# Patient Record
Sex: Female | Born: 1950
Health system: Southern US, Community
[De-identification: ages and names within clinical notes are randomized; demographics above are authoritative.]

## PROBLEM LIST (undated history)

## (undated) DIAGNOSIS — K219 Gastro-esophageal reflux disease without esophagitis: Secondary | ICD-10-CM

## (undated) DIAGNOSIS — M810 Age-related osteoporosis without current pathological fracture: Secondary | ICD-10-CM

## (undated) DIAGNOSIS — J189 Pneumonia, unspecified organism: Secondary | ICD-10-CM

## (undated) DIAGNOSIS — F329 Major depressive disorder, single episode, unspecified: Secondary | ICD-10-CM

## (undated) DIAGNOSIS — M1990 Unspecified osteoarthritis, unspecified site: Secondary | ICD-10-CM

## (undated) DIAGNOSIS — F32A Depression, unspecified: Secondary | ICD-10-CM

## (undated) DIAGNOSIS — J40 Bronchitis, not specified as acute or chronic: Secondary | ICD-10-CM

## (undated) HISTORY — PX: APPENDECTOMY: SHX54

## (undated) HISTORY — PX: KNEE ARTHROSCOPY: SUR90

## (undated) HISTORY — PX: ABDOMINAL HYSTERECTOMY: SHX81

## (undated) HISTORY — PX: WRIST SURGERY: SHX841

---

## 2015-06-18 HISTORY — PX: COLONOSCOPY: SHX174

## 2015-09-21 HISTORY — PX: BACK SURGERY: SHX140

## 2015-10-01 DIAGNOSIS — H5210 Myopia, unspecified eye: Secondary | ICD-10-CM | POA: Diagnosis not present

## 2015-10-01 DIAGNOSIS — H52229 Regular astigmatism, unspecified eye: Secondary | ICD-10-CM | POA: Diagnosis not present

## 2015-10-02 DIAGNOSIS — M25561 Pain in right knee: Secondary | ICD-10-CM | POA: Diagnosis not present

## 2015-10-09 DIAGNOSIS — M4316 Spondylolisthesis, lumbar region: Secondary | ICD-10-CM | POA: Diagnosis not present

## 2015-10-09 DIAGNOSIS — M1711 Unilateral primary osteoarthritis, right knee: Secondary | ICD-10-CM | POA: Diagnosis not present

## 2015-10-09 DIAGNOSIS — M5441 Lumbago with sciatica, right side: Secondary | ICD-10-CM | POA: Diagnosis not present

## 2015-10-25 DIAGNOSIS — M4316 Spondylolisthesis, lumbar region: Secondary | ICD-10-CM | POA: Diagnosis not present

## 2015-10-25 DIAGNOSIS — S3992XA Unspecified injury of lower back, initial encounter: Secondary | ICD-10-CM | POA: Diagnosis not present

## 2015-10-25 DIAGNOSIS — M5116 Intervertebral disc disorders with radiculopathy, lumbar region: Secondary | ICD-10-CM | POA: Diagnosis not present

## 2015-10-25 DIAGNOSIS — M4806 Spinal stenosis, lumbar region: Secondary | ICD-10-CM | POA: Diagnosis not present

## 2015-10-30 DIAGNOSIS — H524 Presbyopia: Secondary | ICD-10-CM | POA: Diagnosis not present

## 2015-10-30 DIAGNOSIS — H52209 Unspecified astigmatism, unspecified eye: Secondary | ICD-10-CM | POA: Diagnosis not present

## 2015-10-30 DIAGNOSIS — H5203 Hypermetropia, bilateral: Secondary | ICD-10-CM | POA: Diagnosis not present

## 2015-10-30 DIAGNOSIS — Z0100 Encounter for examination of eyes and vision without abnormal findings: Secondary | ICD-10-CM | POA: Diagnosis not present

## 2015-11-03 DIAGNOSIS — G8929 Other chronic pain: Secondary | ICD-10-CM | POA: Diagnosis not present

## 2015-11-03 DIAGNOSIS — M5441 Lumbago with sciatica, right side: Secondary | ICD-10-CM | POA: Diagnosis not present

## 2015-11-07 DIAGNOSIS — M25561 Pain in right knee: Secondary | ICD-10-CM | POA: Diagnosis not present

## 2015-11-07 DIAGNOSIS — K219 Gastro-esophageal reflux disease without esophagitis: Secondary | ICD-10-CM | POA: Diagnosis not present

## 2015-12-10 DIAGNOSIS — Z1231 Encounter for screening mammogram for malignant neoplasm of breast: Secondary | ICD-10-CM | POA: Diagnosis not present

## 2015-12-12 DIAGNOSIS — R1013 Epigastric pain: Secondary | ICD-10-CM | POA: Diagnosis not present

## 2015-12-12 DIAGNOSIS — K219 Gastro-esophageal reflux disease without esophagitis: Secondary | ICD-10-CM | POA: Diagnosis not present

## 2015-12-16 DIAGNOSIS — R030 Elevated blood-pressure reading, without diagnosis of hypertension: Secondary | ICD-10-CM | POA: Diagnosis not present

## 2015-12-16 DIAGNOSIS — M5416 Radiculopathy, lumbar region: Secondary | ICD-10-CM | POA: Diagnosis not present

## 2015-12-16 DIAGNOSIS — M4316 Spondylolisthesis, lumbar region: Secondary | ICD-10-CM | POA: Diagnosis not present

## 2015-12-16 DIAGNOSIS — M5126 Other intervertebral disc displacement, lumbar region: Secondary | ICD-10-CM | POA: Diagnosis not present

## 2016-01-05 DIAGNOSIS — E785 Hyperlipidemia, unspecified: Secondary | ICD-10-CM | POA: Diagnosis not present

## 2016-01-05 DIAGNOSIS — Z87891 Personal history of nicotine dependence: Secondary | ICD-10-CM | POA: Diagnosis not present

## 2016-01-05 DIAGNOSIS — Z79899 Other long term (current) drug therapy: Secondary | ICD-10-CM | POA: Diagnosis not present

## 2016-01-05 DIAGNOSIS — R1013 Epigastric pain: Secondary | ICD-10-CM | POA: Diagnosis not present

## 2016-01-05 DIAGNOSIS — G43909 Migraine, unspecified, not intractable, without status migrainosus: Secondary | ICD-10-CM | POA: Diagnosis not present

## 2016-01-05 DIAGNOSIS — K2900 Acute gastritis without bleeding: Secondary | ICD-10-CM | POA: Diagnosis not present

## 2016-01-05 DIAGNOSIS — K219 Gastro-esophageal reflux disease without esophagitis: Secondary | ICD-10-CM | POA: Diagnosis not present

## 2016-01-05 DIAGNOSIS — F419 Anxiety disorder, unspecified: Secondary | ICD-10-CM | POA: Diagnosis not present

## 2016-01-05 DIAGNOSIS — M1990 Unspecified osteoarthritis, unspecified site: Secondary | ICD-10-CM | POA: Diagnosis not present

## 2016-01-05 DIAGNOSIS — K2970 Gastritis, unspecified, without bleeding: Secondary | ICD-10-CM | POA: Diagnosis not present

## 2016-01-05 DIAGNOSIS — F329 Major depressive disorder, single episode, unspecified: Secondary | ICD-10-CM | POA: Diagnosis not present

## 2016-01-05 HISTORY — PX: ESOPHAGOGASTRODUODENOSCOPY: SHX1529

## 2016-01-09 DIAGNOSIS — N390 Urinary tract infection, site not specified: Secondary | ICD-10-CM | POA: Diagnosis not present

## 2016-01-09 DIAGNOSIS — B373 Candidiasis of vulva and vagina: Secondary | ICD-10-CM | POA: Diagnosis not present

## 2016-01-23 DIAGNOSIS — B379 Candidiasis, unspecified: Secondary | ICD-10-CM | POA: Diagnosis not present

## 2016-01-29 DIAGNOSIS — M5416 Radiculopathy, lumbar region: Secondary | ICD-10-CM | POA: Diagnosis not present

## 2016-01-29 DIAGNOSIS — M5126 Other intervertebral disc displacement, lumbar region: Secondary | ICD-10-CM | POA: Diagnosis not present

## 2016-02-11 DIAGNOSIS — J302 Other seasonal allergic rhinitis: Secondary | ICD-10-CM | POA: Diagnosis not present

## 2016-02-11 DIAGNOSIS — R05 Cough: Secondary | ICD-10-CM | POA: Diagnosis not present

## 2016-02-13 DIAGNOSIS — R05 Cough: Secondary | ICD-10-CM | POA: Diagnosis not present

## 2016-02-27 DIAGNOSIS — M62838 Other muscle spasm: Secondary | ICD-10-CM | POA: Diagnosis not present

## 2016-03-04 DIAGNOSIS — M5126 Other intervertebral disc displacement, lumbar region: Secondary | ICD-10-CM | POA: Diagnosis not present

## 2016-03-04 DIAGNOSIS — M5416 Radiculopathy, lumbar region: Secondary | ICD-10-CM | POA: Diagnosis not present

## 2016-03-10 DIAGNOSIS — M545 Low back pain: Secondary | ICD-10-CM | POA: Diagnosis not present

## 2016-03-10 DIAGNOSIS — L298 Other pruritus: Secondary | ICD-10-CM | POA: Diagnosis not present

## 2016-03-10 DIAGNOSIS — M150 Primary generalized (osteo)arthritis: Secondary | ICD-10-CM | POA: Diagnosis not present

## 2016-03-10 DIAGNOSIS — E7800 Pure hypercholesterolemia, unspecified: Secondary | ICD-10-CM | POA: Diagnosis not present

## 2016-03-10 DIAGNOSIS — K219 Gastro-esophageal reflux disease without esophagitis: Secondary | ICD-10-CM | POA: Diagnosis not present

## 2016-03-10 DIAGNOSIS — Z1389 Encounter for screening for other disorder: Secondary | ICD-10-CM | POA: Diagnosis not present

## 2016-03-10 DIAGNOSIS — E559 Vitamin D deficiency, unspecified: Secondary | ICD-10-CM | POA: Diagnosis not present

## 2016-03-10 DIAGNOSIS — Z0000 Encounter for general adult medical examination without abnormal findings: Secondary | ICD-10-CM | POA: Diagnosis not present

## 2016-03-10 DIAGNOSIS — M810 Age-related osteoporosis without current pathological fracture: Secondary | ICD-10-CM | POA: Diagnosis not present

## 2016-03-10 DIAGNOSIS — G8929 Other chronic pain: Secondary | ICD-10-CM | POA: Diagnosis not present

## 2016-03-11 DIAGNOSIS — M79651 Pain in right thigh: Secondary | ICD-10-CM | POA: Diagnosis not present

## 2016-03-11 DIAGNOSIS — M25551 Pain in right hip: Secondary | ICD-10-CM | POA: Diagnosis not present

## 2016-03-11 DIAGNOSIS — S79911A Unspecified injury of right hip, initial encounter: Secondary | ICD-10-CM | POA: Diagnosis not present

## 2016-03-24 DIAGNOSIS — M810 Age-related osteoporosis without current pathological fracture: Secondary | ICD-10-CM | POA: Diagnosis not present

## 2016-03-26 DIAGNOSIS — G4700 Insomnia, unspecified: Secondary | ICD-10-CM | POA: Diagnosis not present

## 2016-03-30 DIAGNOSIS — M4316 Spondylolisthesis, lumbar region: Secondary | ICD-10-CM | POA: Diagnosis not present

## 2016-03-30 DIAGNOSIS — R030 Elevated blood-pressure reading, without diagnosis of hypertension: Secondary | ICD-10-CM | POA: Diagnosis not present

## 2016-03-31 DIAGNOSIS — N952 Postmenopausal atrophic vaginitis: Secondary | ICD-10-CM | POA: Diagnosis not present

## 2016-04-01 ENCOUNTER — Other Ambulatory Visit: Payer: Self-pay | Admitting: Neurosurgery

## 2016-05-19 ENCOUNTER — Encounter (HOSPITAL_COMMUNITY): Payer: Self-pay | Admitting: Surgery

## 2016-05-19 ENCOUNTER — Encounter (HOSPITAL_COMMUNITY)
Admission: RE | Admit: 2016-05-19 | Discharge: 2016-05-19 | Disposition: A | Discharge status: Home / Self Care | Payer: Commercial Managed Care - HMO | Source: Ambulatory Visit | Attending: Neurosurgery | Admitting: Neurosurgery

## 2016-05-19 DIAGNOSIS — Z01812 Encounter for preprocedural laboratory examination: Secondary | ICD-10-CM | POA: Insufficient documentation

## 2016-05-19 DIAGNOSIS — Z0183 Encounter for blood typing: Secondary | ICD-10-CM | POA: Insufficient documentation

## 2016-05-19 DIAGNOSIS — M4316 Spondylolisthesis, lumbar region: Secondary | ICD-10-CM | POA: Diagnosis not present

## 2016-05-19 HISTORY — DX: Pneumonia, unspecified organism: J18.9

## 2016-05-19 HISTORY — DX: Major depressive disorder, single episode, unspecified: F32.9

## 2016-05-19 HISTORY — DX: Depression, unspecified: F32.A

## 2016-05-19 HISTORY — DX: Age-related osteoporosis without current pathological fracture: M81.0

## 2016-05-19 HISTORY — DX: Gastro-esophageal reflux disease without esophagitis: K21.9

## 2016-05-19 HISTORY — DX: Bronchitis, not specified as acute or chronic: J40

## 2016-05-19 HISTORY — DX: Unspecified osteoarthritis, unspecified site: M19.90

## 2016-05-19 LAB — CBC
HEMATOCRIT: 39.6 % (ref 36.0–46.0)
HEMOGLOBIN: 12.5 g/dL (ref 12.0–15.0)
MCH: 27.8 pg (ref 26.0–34.0)
MCHC: 31.6 g/dL (ref 30.0–36.0)
MCV: 88.2 fL (ref 78.0–100.0)
Platelets: 401 10*3/uL — ABNORMAL HIGH (ref 150–400)
RBC: 4.49 MIL/uL (ref 3.87–5.11)
RDW: 14 % (ref 11.5–15.5)
WBC: 5.8 10*3/uL (ref 4.0–10.5)

## 2016-05-19 LAB — TYPE AND SCREEN
ABO/RH(D): O POS
ANTIBODY SCREEN: NEGATIVE

## 2016-05-19 LAB — BASIC METABOLIC PANEL
Anion gap: 8 (ref 5–15)
BUN: 10 mg/dL (ref 6–20)
CALCIUM: 9.6 mg/dL (ref 8.9–10.3)
CHLORIDE: 108 mmol/L (ref 101–111)
CO2: 27 mmol/L (ref 22–32)
CREATININE: 0.59 mg/dL (ref 0.44–1.00)
GFR calc non Af Amer: >60 mL/min (ref >60)
GLUCOSE: 77 mg/dL (ref 65–99)
Potassium: 3.6 mmol/L (ref 3.5–5.1)
Sodium: 143 mmol/L (ref 135–145)

## 2016-05-19 LAB — SURGICAL PCR SCREEN
MRSA, PCR: NEGATIVE
STAPHYLOCOCCUS AUREUS: NEGATIVE

## 2016-05-19 LAB — ABO/RH: ABO/RH(D): O POS

## 2016-05-19 MED ORDER — CHLORHEXIDINE GLUCONATE CLOTH 2 % EX PADS: 6.0000 | MEDICATED_PAD | Freq: Once | CUTANEOUS | Status: DC

## 2016-05-19 NOTE — Pre-Procedure Instructions (Signed)
Havanah Patla  05/19/2016     Your procedure is scheduled on : Thursday May 27, 2016 at 7:30 AM.   Report to [facility] Admitting at 5:30 AM.  Call this number if you have problems the morning of surgery: [phone]    Remember:  Do not eat food or drink liquids after midnight.  Take these medicines the morning of surgery with A SIP OF WATER : Dexilant   Stop taking any vitamins, herbal medications/supplements, NSAIDS, Ibuprofen, Advil, Motrin, Aleve, etc today    Do not wear jewelry, make-up or nail polish.  Do not wear lotions, powders, or perfumes, or deoderant.  Do not shave 48 hours prior to surgery.    Do not bring valuables to the hospital.  [facility] is not responsible for any belongings or valuables.  Contacts, dentures or bridgework may not be worn into surgery.  Leave your suitcase in the car.  After surgery it may be brought to your room.  For patients admitted to the hospital, discharge time will be determined by your treatment team.  Patients discharged the day of surgery will not be allowed to drive home.   Name and phone number of your driver:    Special instructions:  Shower using CHG soap the night before and the morning of your surgery  Please read over the following fact sheets that you were given.

## 2016-05-26 MED ORDER — CEFAZOLIN SODIUM-DEXTROSE 2-4 GM/100ML-% IV SOLN
2.0000 g | INTRAVENOUS | Status: AC
  Administered 2016-05-27: 2 g via INTRAVENOUS
  Filled 2016-05-26: qty 100

## 2016-05-27 ENCOUNTER — Inpatient Hospital Stay (HOSPITAL_COMMUNITY): Payer: Commercial Managed Care - HMO

## 2016-05-27 ENCOUNTER — Inpatient Hospital Stay (HOSPITAL_COMMUNITY): Payer: Commercial Managed Care - HMO | Admitting: Anesthesiology

## 2016-05-27 ENCOUNTER — Inpatient Hospital Stay (HOSPITAL_COMMUNITY)
Admission: RE | Disposition: A | Discharge status: Home / Self Care | Payer: Self-pay | Source: Ambulatory Visit | Attending: Neurosurgery

## 2016-05-27 ENCOUNTER — Inpatient Hospital Stay (HOSPITAL_COMMUNITY)
Admission: RE | Admit: 2016-05-27 | Discharge: 2016-05-28 | DRG: 460 | Disposition: A | Discharge status: Home / Self Care | Payer: Commercial Managed Care - HMO | Source: Ambulatory Visit | Attending: Neurosurgery | Admitting: Neurosurgery

## 2016-05-27 ENCOUNTER — Encounter (HOSPITAL_COMMUNITY): Payer: Self-pay | Admitting: Urology

## 2016-05-27 DIAGNOSIS — M5116 Intervertebral disc disorders with radiculopathy, lumbar region: Secondary | ICD-10-CM | POA: Diagnosis not present

## 2016-05-27 DIAGNOSIS — Z419 Encounter for procedure for purposes other than remedying health state, unspecified: Secondary | ICD-10-CM

## 2016-05-27 DIAGNOSIS — K219 Gastro-esophageal reflux disease without esophagitis: Secondary | ICD-10-CM | POA: Diagnosis present

## 2016-05-27 DIAGNOSIS — Z7982 Long term (current) use of aspirin: Secondary | ICD-10-CM | POA: Diagnosis not present

## 2016-05-27 DIAGNOSIS — M4316 Spondylolisthesis, lumbar region: Secondary | ICD-10-CM | POA: Diagnosis not present

## 2016-05-27 DIAGNOSIS — Z888 Allergy status to other drugs, medicaments and biological substances status: Secondary | ICD-10-CM | POA: Diagnosis not present

## 2016-05-27 DIAGNOSIS — Z981 Arthrodesis status: Secondary | ICD-10-CM | POA: Diagnosis not present

## 2016-05-27 DIAGNOSIS — Z87891 Personal history of nicotine dependence: Secondary | ICD-10-CM | POA: Diagnosis not present

## 2016-05-27 DIAGNOSIS — M4806 Spinal stenosis, lumbar region: Principal | ICD-10-CM | POA: Diagnosis present

## 2016-05-27 DIAGNOSIS — M1990 Unspecified osteoarthritis, unspecified site: Secondary | ICD-10-CM | POA: Diagnosis not present

## 2016-05-27 DIAGNOSIS — R269 Unspecified abnormalities of gait and mobility: Secondary | ICD-10-CM | POA: Diagnosis not present

## 2016-05-27 SURGERY — POSTERIOR LUMBAR FUSION 1 LEVEL
Anesthesia: General | Site: Spine Lumbar

## 2016-05-27 MED ORDER — MORPHINE SULFATE (PF) 4 MG/ML IV SOLN
INTRAVENOUS | Status: AC
  Administered 2016-05-27: 4 mg
  Filled 2016-05-27: qty 1

## 2016-05-27 MED ORDER — DEXAMETHASONE SODIUM PHOSPHATE 10 MG/ML IJ SOLN
INTRAMUSCULAR | Status: DC | PRN
  Administered 2016-05-27: 10 mg via INTRAVENOUS

## 2016-05-27 MED ORDER — PROPOFOL 10 MG/ML IV BOLUS
INTRAVENOUS | Status: DC | PRN
Start: 2016-05-27 — End: 2016-05-27
  Administered 2016-05-27: 110 mg via INTRAVENOUS

## 2016-05-27 MED ORDER — ONDANSETRON HCL 4 MG/2ML IJ SOLN
INTRAMUSCULAR | Status: DC | PRN
Start: 2016-05-27 — End: 2016-05-27
  Administered 2016-05-27: 4 mg via INTRAVENOUS

## 2016-05-27 MED ORDER — OXYCODONE-ACETAMINOPHEN 5-325 MG PO TABS
ORAL_TABLET | ORAL | Status: AC
  Administered 2016-05-27: 2 via ORAL
  Filled 2016-05-27: qty 2

## 2016-05-27 MED ORDER — 0.9 % SODIUM CHLORIDE (POUR BTL) OPTIME
TOPICAL | Status: DC | PRN
  Administered 2016-05-27: 1000 mL

## 2016-05-27 MED ORDER — DIAZEPAM 5 MG PO TABS
5.0000 mg | ORAL_TABLET | Freq: Four times a day (QID) | ORAL | Status: DC | PRN
  Administered 2016-05-27: 5 mg via ORAL

## 2016-05-27 MED ORDER — ACETAMINOPHEN 650 MG RE SUPP: 650.0000 mg | RECTAL | Status: DC | PRN

## 2016-05-27 MED ORDER — MIDAZOLAM HCL 2 MG/2ML IJ SOLN
INTRAMUSCULAR | Status: AC
  Filled 2016-05-27: qty 2

## 2016-05-27 MED ORDER — PROPOFOL 10 MG/ML IV BOLUS
INTRAVENOUS | Status: AC
  Filled 2016-05-27: qty 40

## 2016-05-27 MED ORDER — ONDANSETRON HCL 4 MG/2ML IJ SOLN
INTRAMUSCULAR | Status: AC
  Filled 2016-05-27: qty 2

## 2016-05-27 MED ORDER — SODIUM CHLORIDE 0.9 % IR SOLN
Status: DC | PRN
  Administered 2016-05-27: 500 mL

## 2016-05-27 MED ORDER — BISACODYL 10 MG RE SUPP: 10.0000 mg | Freq: Every day | RECTAL | Status: DC | PRN

## 2016-05-27 MED ORDER — ACETAMINOPHEN 325 MG PO TABS: 325.0000 mg | ORAL_TABLET | ORAL | Status: DC | PRN

## 2016-05-27 MED ORDER — VANCOMYCIN HCL 1000 MG IV SOLR
INTRAVENOUS | Status: DC | PRN
  Administered 2016-05-27: 1000 mg via TOPICAL

## 2016-05-27 MED ORDER — ACETAMINOPHEN 160 MG/5ML PO SOLN
325.0000 mg | ORAL | Status: DC | PRN
  Filled 2016-05-27: qty 20.3

## 2016-05-27 MED ORDER — SUGAMMADEX SODIUM 200 MG/2ML IV SOLN
INTRAVENOUS | Status: DC | PRN
  Administered 2016-05-27: 200 mg via INTRAVENOUS

## 2016-05-27 MED ORDER — LACTATED RINGERS IV SOLN
INTRAVENOUS | Status: DC | PRN
  Administered 2016-05-27 (×2): via INTRAVENOUS

## 2016-05-27 MED ORDER — FENTANYL CITRATE (PF) 100 MCG/2ML IJ SOLN
INTRAMUSCULAR | Status: DC | PRN
  Administered 2016-05-27: 25 ug via INTRAVENOUS
  Administered 2016-05-27: 100 ug via INTRAVENOUS
  Administered 2016-05-27: 25 ug via INTRAVENOUS
  Administered 2016-05-27: 50 ug via INTRAVENOUS

## 2016-05-27 MED ORDER — MORPHINE SULFATE (PF) 2 MG/ML IV SOLN: 1.0000 mg | INTRAVENOUS | Status: DC | PRN

## 2016-05-27 MED ORDER — PHENYLEPHRINE 40 MCG/ML (10ML) SYRINGE FOR IV PUSH (FOR BLOOD PRESSURE SUPPORT)
PREFILLED_SYRINGE | INTRAVENOUS | Status: AC
  Filled 2016-05-27: qty 10

## 2016-05-27 MED ORDER — THROMBIN 20000 UNITS EX SOLR
CUTANEOUS | Status: DC | PRN
  Administered 2016-05-27: 20 mL via TOPICAL

## 2016-05-27 MED ORDER — HYDROCODONE-ACETAMINOPHEN 5-325 MG PO TABS
1.0000 | ORAL_TABLET | ORAL | Status: DC | PRN
  Administered 2016-05-27: 2 via ORAL
  Administered 2016-05-28 (×2): 1 via ORAL
  Filled 2016-05-27: qty 1
  Filled 2016-05-27 (×2): qty 2

## 2016-05-27 MED ORDER — MIDAZOLAM HCL 5 MG/5ML IJ SOLN
INTRAMUSCULAR | Status: DC | PRN
  Administered 2016-05-27: 2 mg via INTRAVENOUS

## 2016-05-27 MED ORDER — OXYCODONE HCL 5 MG PO TABS: 5.0000 mg | ORAL_TABLET | Freq: Once | ORAL | Status: DC | PRN

## 2016-05-27 MED ORDER — DOCUSATE SODIUM 100 MG PO CAPS
100.0000 mg | ORAL_CAPSULE | Freq: Two times a day (BID) | ORAL | Status: DC
  Administered 2016-05-27 – 2016-05-28 (×2): 100 mg via ORAL
  Filled 2016-05-27: qty 1

## 2016-05-27 MED ORDER — BACITRACIN ZINC 500 UNIT/GM EX OINT
TOPICAL_OINTMENT | CUTANEOUS | Status: DC | PRN
  Administered 2016-05-27: 1 via TOPICAL

## 2016-05-27 MED ORDER — PHENOL 1.4 % MT LIQD: 1.0000 | OROMUCOSAL | Status: DC | PRN

## 2016-05-27 MED ORDER — OXYCODONE HCL 5 MG/5ML PO SOLN: 5.0000 mg | Freq: Once | ORAL | Status: DC | PRN

## 2016-05-27 MED ORDER — DIAZEPAM 5 MG PO TABS
ORAL_TABLET | ORAL | Status: AC
  Administered 2016-05-27: 5 mg via ORAL
  Filled 2016-05-27: qty 1

## 2016-05-27 MED ORDER — ACETAMINOPHEN 325 MG PO TABS: 650.0000 mg | ORAL_TABLET | ORAL | Status: DC | PRN

## 2016-05-27 MED ORDER — PHENYLEPHRINE HCL 10 MG/ML IJ SOLN
INTRAMUSCULAR | Status: DC | PRN
  Administered 2016-05-27 (×6): 80 ug via INTRAVENOUS

## 2016-05-27 MED ORDER — PROPOFOL 10 MG/ML IV BOLUS
INTRAVENOUS | Status: AC
  Filled 2016-05-27: qty 20

## 2016-05-27 MED ORDER — CEFAZOLIN SODIUM-DEXTROSE 2-4 GM/100ML-% IV SOLN
2.0000 g | Freq: Three times a day (TID) | INTRAVENOUS | Status: AC
  Administered 2016-05-27 (×2): 2 g via INTRAVENOUS
  Filled 2016-05-27 (×2): qty 100

## 2016-05-27 MED ORDER — DEXAMETHASONE SODIUM PHOSPHATE 10 MG/ML IJ SOLN
INTRAMUSCULAR | Status: AC
  Filled 2016-05-27: qty 1

## 2016-05-27 MED ORDER — OXYCODONE-ACETAMINOPHEN 5-325 MG PO TABS
1.0000 | ORAL_TABLET | ORAL | Status: DC | PRN
  Administered 2016-05-27 – 2016-05-28 (×2): 2 via ORAL
  Filled 2016-05-27: qty 2

## 2016-05-27 MED ORDER — EPHEDRINE SULFATE 50 MG/ML IJ SOLN
INTRAMUSCULAR | Status: DC | PRN
  Administered 2016-05-27: 5 mg via INTRAVENOUS
  Administered 2016-05-27: 10 mg via INTRAVENOUS
  Administered 2016-05-27 (×3): 5 mg via INTRAVENOUS
  Administered 2016-05-27 (×2): 10 mg via INTRAVENOUS

## 2016-05-27 MED ORDER — EPHEDRINE 5 MG/ML INJ
INTRAVENOUS | Status: AC
  Filled 2016-05-27: qty 10

## 2016-05-27 MED ORDER — FENTANYL CITRATE (PF) 100 MCG/2ML IJ SOLN
25.0000 ug | INTRAMUSCULAR | Status: DC | PRN
  Administered 2016-05-27 (×2): 50 ug via INTRAVENOUS

## 2016-05-27 MED ORDER — ROCURONIUM BROMIDE 10 MG/ML (PF) SYRINGE
PREFILLED_SYRINGE | INTRAVENOUS | Status: AC
  Filled 2016-05-27: qty 10

## 2016-05-27 MED ORDER — FENTANYL CITRATE (PF) 100 MCG/2ML IJ SOLN
INTRAMUSCULAR | Status: AC
  Filled 2016-05-27: qty 4

## 2016-05-27 MED ORDER — ALUM & MAG HYDROXIDE-SIMETH 200-200-20 MG/5ML PO SUSP: 30.0000 mL | Freq: Four times a day (QID) | ORAL | Status: DC | PRN

## 2016-05-27 MED ORDER — BUPIVACAINE LIPOSOME 1.3 % IJ SUSP
20.0000 mL | INTRAMUSCULAR | Status: AC
  Administered 2016-05-27: 20 mL
  Filled 2016-05-27 (×2): qty 20

## 2016-05-27 MED ORDER — LIDOCAINE HCL (CARDIAC) 20 MG/ML IV SOLN: INTRAVENOUS | Status: DC | PRN

## 2016-05-27 MED ORDER — FENTANYL CITRATE (PF) 100 MCG/2ML IJ SOLN
INTRAMUSCULAR | Status: AC
  Administered 2016-05-27: 50 ug via INTRAVENOUS
  Filled 2016-05-27: qty 2

## 2016-05-27 MED ORDER — ROCURONIUM BROMIDE 100 MG/10ML IV SOLN
INTRAVENOUS | Status: DC | PRN
Start: 2016-05-27 — End: 2016-05-27
  Administered 2016-05-27 (×2): 20 mg via INTRAVENOUS
  Administered 2016-05-27: 40 mg via INTRAVENOUS

## 2016-05-27 MED ORDER — LACTATED RINGERS IV SOLN: INTRAVENOUS | Status: DC

## 2016-05-27 MED ORDER — MENTHOL 3 MG MT LOZG: 1.0000 | LOZENGE | OROMUCOSAL | Status: DC | PRN

## 2016-05-27 MED ORDER — PANTOPRAZOLE SODIUM 40 MG PO TBEC
40.0000 mg | DELAYED_RELEASE_TABLET | Freq: Every day | ORAL | Status: DC
  Administered 2016-05-28: 40 mg via ORAL
  Filled 2016-05-27 (×2): qty 1

## 2016-05-27 MED ORDER — ONDANSETRON HCL 4 MG/2ML IJ SOLN
4.0000 mg | INTRAMUSCULAR | Status: DC | PRN
  Administered 2016-05-27: 4 mg via INTRAVENOUS
  Filled 2016-05-27: qty 2

## 2016-05-27 MED ORDER — BUPIVACAINE-EPINEPHRINE (PF) 0.5% -1:200000 IJ SOLN
INTRAMUSCULAR | Status: DC | PRN
Start: 2016-05-27 — End: 2016-05-27
  Administered 2016-05-27: 10 mL

## 2016-05-27 MED ORDER — VANCOMYCIN HCL 1000 MG IV SOLR
INTRAVENOUS | Status: AC
  Filled 2016-05-27: qty 1000

## 2016-05-27 MED ORDER — SUGAMMADEX SODIUM 200 MG/2ML IV SOLN
INTRAVENOUS | Status: AC
  Filled 2016-05-27: qty 2

## 2016-05-27 SURGICAL SUPPLY — 65 items
BAG DECANTER FOR FLEXI CONT (MISCELLANEOUS) ×3 IMPLANT
BENZOIN TINCTURE PRP APPL 2/3 (GAUZE/BANDAGES/DRESSINGS) ×3 IMPLANT
BLADE CLIPPER SURG (BLADE) IMPLANT
BUR MATCHSTICK NEURO 3.0 LAGG (BURR) ×3 IMPLANT
BUR PRECISION FLUTE 6.0 (BURR) ×3 IMPLANT
CAGE ALTERA 10X31MM-10-14-15 (Cage) ×1 IMPLANT
CAGE ALTERA 10X31X10-14 15D (Cage) ×2 IMPLANT
CANISTER SUCT 3000ML PPV (MISCELLANEOUS) ×3 IMPLANT
CAP REVERE LOCKING (Cap) ×12 IMPLANT
CLOSURE WOUND 1/2 X4 (GAUZE/BANDAGES/DRESSINGS) ×1
CONT SPEC 4OZ CLIKSEAL STRL BL (MISCELLANEOUS) ×3 IMPLANT
COVER BACK TABLE 60X90IN (DRAPES) ×3 IMPLANT
COVER TABLE BACK 60X90 (DRAPES) ×3 IMPLANT
DRAPE C-ARM 42X72 X-RAY (DRAPES) ×6 IMPLANT
DRAPE HALF SHEET 40X57 (DRAPES) ×3 IMPLANT
DRAPE LAPAROTOMY 100X72X124 (DRAPES) ×3 IMPLANT
DRAPE POUCH INSTRU U-SHP 10X18 (DRAPES) ×3 IMPLANT
DRAPE PROXIMA HALF (DRAPES) ×3 IMPLANT
DRAPE SURG 17X23 STRL (DRAPES) ×12 IMPLANT
ELECT BLADE 4.0 EZ CLEAN MEGAD (MISCELLANEOUS) ×3
ELECT REM PT RETURN 9FT ADLT (ELECTROSURGICAL) ×3
ELECTRODE BLDE 4.0 EZ CLN MEGD (MISCELLANEOUS) ×1 IMPLANT
ELECTRODE REM PT RTRN 9FT ADLT (ELECTROSURGICAL) ×1 IMPLANT
EVACUATOR 1/8 PVC DRAIN (DRAIN) IMPLANT
GAUZE SPONGE 4X4 12PLY STRL (GAUZE/BANDAGES/DRESSINGS) ×3 IMPLANT
GAUZE SPONGE 4X4 16PLY XRAY LF (GAUZE/BANDAGES/DRESSINGS) IMPLANT
GLOVE BIO SURGEON STRL SZ8 (GLOVE) ×9 IMPLANT
GLOVE BIO SURGEON STRL SZ8.5 (GLOVE) ×6 IMPLANT
GLOVE BIOGEL PI IND STRL 7.0 (GLOVE) ×3 IMPLANT
GLOVE BIOGEL PI INDICATOR 7.0 (GLOVE) ×6
GLOVE EXAM NITRILE LRG STRL (GLOVE) IMPLANT
GLOVE EXAM NITRILE XL STR (GLOVE) IMPLANT
GLOVE EXAM NITRILE XS STR PU (GLOVE) IMPLANT
GLOVE SURG SS PI 6.5 STRL IVOR (GLOVE) ×6 IMPLANT
GOWN STRL REUS W/ TWL LRG LVL3 (GOWN DISPOSABLE) ×1 IMPLANT
GOWN STRL REUS W/ TWL XL LVL3 (GOWN DISPOSABLE) ×4 IMPLANT
GOWN STRL REUS W/TWL 2XL LVL3 (GOWN DISPOSABLE) IMPLANT
GOWN STRL REUS W/TWL LRG LVL3 (GOWN DISPOSABLE) ×2
GOWN STRL REUS W/TWL XL LVL3 (GOWN DISPOSABLE) ×8
KIT BASIN OR (CUSTOM PROCEDURE TRAY) ×3 IMPLANT
KIT ROOM TURNOVER OR (KITS) ×3 IMPLANT
MILL MEDIUM DISP (BLADE) ×3 IMPLANT
NEEDLE HYPO 21X1.5 SAFETY (NEEDLE) ×3 IMPLANT
NEEDLE HYPO 22GX1.5 SAFETY (NEEDLE) IMPLANT
NEEDLE HYPO 25X1 1.5 SAFETY (NEEDLE) ×3 IMPLANT
NS IRRIG 1000ML POUR BTL (IV SOLUTION) ×3 IMPLANT
PACK LAMINECTOMY NEURO (CUSTOM PROCEDURE TRAY) ×3 IMPLANT
PAD ARMBOARD 7.5X6 YLW CONV (MISCELLANEOUS) ×15 IMPLANT
PATTIES SURGICAL .5 X1 (DISPOSABLE) IMPLANT
ROD REVERE 6.35 40MM (Rod) ×6 IMPLANT
SCREW 7.5X45MM (Screw) ×12 IMPLANT
SPONGE LAP 4X18 X RAY DECT (DISPOSABLE) IMPLANT
SPONGE NEURO XRAY DETECT 1X3 (DISPOSABLE) IMPLANT
SPONGE SURGIFOAM ABS GEL 100 (HEMOSTASIS) ×3 IMPLANT
STRIP BIOACTIVE 20CC 25X100X8 (Miscellaneous) ×3 IMPLANT
STRIP CLOSURE SKIN 1/2X4 (GAUZE/BANDAGES/DRESSINGS) ×2 IMPLANT
SUT VIC AB 1 CT1 18XBRD ANBCTR (SUTURE) ×2 IMPLANT
SUT VIC AB 1 CT1 8-18 (SUTURE) ×4
SUT VIC AB 2-0 CP2 18 (SUTURE) ×6 IMPLANT
SYR 20CC LL (SYRINGE) ×3 IMPLANT
TAPE CLOTH SURG 4X10 WHT LF (GAUZE/BANDAGES/DRESSINGS) ×3 IMPLANT
TOWEL OR 17X24 6PK STRL BLUE (TOWEL DISPOSABLE) ×3 IMPLANT
TOWEL OR 17X26 10 PK STRL BLUE (TOWEL DISPOSABLE) ×3 IMPLANT
TRAY FOLEY W/METER SILVER 16FR (SET/KITS/TRAYS/PACK) ×3 IMPLANT
WATER STERILE IRR 1000ML POUR (IV SOLUTION) ×3 IMPLANT

## 2016-05-27 NOTE — Progress Notes (Signed)
Orthopedic Tech Progress Note Patient Details:  Isabella Rojas July 29, 1951 [id number] Patient already has brace. Patient ID: Isabella Rojas, female   DOB: [birth year], 65 y.o.   MRN: [id number]   Braulio Bosch 05/27/2016, 3:33 PM

## 2016-05-27 NOTE — Anesthesia Preprocedure Evaluation (Signed)
Anesthesia Evaluation  Patient identified by MRN, date of birth, ID band Patient awake    Reviewed: Allergy & Precautions, NPO status , Patient's Chart, lab work & pertinent test results  History of Anesthesia Complications Negative for: history of anesthetic complications  Airway Mallampati: I  TM Distance: >3 FB Neck ROM: Full    Dental  (+) Edentulous Upper, Edentulous Lower   Pulmonary neg pulmonary ROS, former smoker,    breath sounds clear to auscultation       Cardiovascular negative cardio ROS   Rhythm:Regular     Neuro/Psych PSYCHIATRIC DISORDERS Depression negative neurological ROS     GI/Hepatic Neg liver ROS, GERD  Controlled,  Endo/Other  negative endocrine ROS  Renal/GU negative Renal ROS     Musculoskeletal  (+) Arthritis ,   Abdominal   Peds  Hematology negative hematology ROS (+)   Anesthesia Other Findings   Reproductive/Obstetrics                             Anesthesia Physical Anesthesia Plan  ASA: II  Anesthesia Plan: General   Post-op Pain Management:    Induction: Intravenous  Airway Management Planned: Oral ETT  Additional Equipment: None  Intra-op Plan:   Post-operative Plan: Extubation in OR  Informed Consent: I have reviewed the patients History and Physical, chart, labs and discussed the procedure including the risks, benefits and alternatives for the proposed anesthesia with the patient or authorized representative who has indicated his/her understanding and acceptance.   Dental advisory given  Plan Discussed with: CRNA and Surgeon  Anesthesia Plan Comments:         Anesthesia Quick Evaluation

## 2016-05-27 NOTE — H&P (Signed)
Subjective: Patient is a 65 year old white female who has complained of back, buttock and leg pain consistent with neurogenic claudication. She has failed medical management and was worked up with lumbar x-rays the lumbar MRI. This demonstrated an L4-5 spondylolisthesis with spinal stenosis. I discussed the situation with the patient. We discussed the various treatment options. She has decided to proceed with surgery.   Past Medical History:  Diagnosis Date  . Arthritis   . Bronchitis   . Depression   . GERD (gastroesophageal reflux disease)   . Osteoporosis   . Pneumonia    hx of    Past Surgical History:  Procedure Laterality Date  . ABDOMINAL HYSTERECTOMY    . APPENDECTOMY    . COLONOSCOPY WITH ESOPHAGOGASTRODUODENOSCOPY (EGD)    . KNEE ARTHROSCOPY Right    X 2  . WRIST SURGERY Right     Allergies  Allergen Reactions  . Zoloft [Sertraline Hcl] Palpitations    "I started sweating and I felt like I was going to have a heart attack"    Social History  Substance Use Topics  . Smoking status: Former Research scientist (life sciences)  . Smokeless tobacco: Never Used  . Alcohol use 1.2 oz/week    2 Glasses of wine per week     Comment: occasional    History reviewed. No pertinent family history. Prior to Admission medications   Medication Sig Start Date End Date Taking? Authorizing Provider  aspirin EC 81 MG tablet Take 81 mg by mouth daily.   Yes Historical Provider, MD  DEXILANT 30 MG capsule Take 30 mg by mouth daily. 04/08/16  Yes Historical Provider, MD     Review of Systems  Positive ROS: As above  All other systems have been reviewed and were otherwise negative with the exception of those mentioned in the HPI and as above.  Objective: Vital signs in last 24 hours: Temp:  [98.5 F (36.9 C)] 98.5 F (36.9 C) (09/07 0617) Pulse Rate:  [80] 80 (09/07 0617) Resp:  [20] 20 (09/07 0617) BP: (170)/(88) 170/88 (09/07 0617) SpO2:  [95 %] 95 % (09/07 0617) Weight:  [52.5 kg (115 lb 11.2 oz)]  52.5 kg (115 lb 11.2 oz) (09/07 0617)  General Appearance: Alert, cooperative, no distress, Head: Normocephalic, without obvious abnormality, atraumatic Eyes: PERRL, conjunctiva/corneas clear, EOM's intact,    Ears: Normal  Throat: Normal  Neck: Supple, symmetrical, trachea midline, no adenopathy; thyroid: No enlargement/tenderness/nodules; no carotid bruit or JVD Back: Symmetric, no curvature, ROM normal, no CVA tenderness Lungs: Clear to auscultation bilaterally, respirations unlabored Heart: Regular rate and rhythm, no murmur, rub or gallop Abdomen: Soft, non-tender,, no masses, no organomegaly Extremities: Extremities normal, atraumatic, no cyanosis or edema Pulses: 2+ and symmetric all extremities Skin: Skin color, texture, turgor normal, no rashes or lesions  NEUROLOGIC:   Mental status: alert and oriented, no aphasia, good attention span, Fund of knowledge/ memory ok Motor Exam - grossly normal Sensory Exam - grossly normal Reflexes:  Coordination - grossly normal Gait - grossly normal Balance - grossly normal Cranial Nerves: I: smell Not tested  II: visual acuity  OS: Normal  OD: Normal   II: visual fields Full to confrontation  II: pupils Equal, round, reactive to light  III,VII: ptosis None  III,IV,VI: extraocular muscles  Full ROM  V: mastication Normal  V: facial light touch sensation  Normal  V,VII: corneal reflex  Present  VII: facial muscle function - upper  Normal  VII: facial muscle function - lower Normal  VIII:  hearing Not tested  IX: soft palate elevation  Normal  IX,X: gag reflex Present  XI: trapezius strength  5/5  XI: sternocleidomastoid strength 5/5  XI: neck flexion strength  5/5  XII: tongue strength  Normal    Data Review Lab Results  Component Value Date   WBC 5.8 05/19/2016   HGB 12.5 05/19/2016   HCT 39.6 05/19/2016   MCV 88.2 05/19/2016   PLT 401 (H) 05/19/2016   Lab Results  Component Value Date   NA 143 05/19/2016   K 3.6  05/19/2016   CL 108 05/19/2016   CO2 27 05/19/2016   BUN 10 05/19/2016   CREATININE 0.59 05/19/2016   GLUCOSE 77 05/19/2016   No results found for: INR, PROTIME  Assessment/Plan: L4-5 spondylosis, spinal stenosis, lumbago, lumbar radiculopathy, neurogenic claudication: I have discussed the situation with the patient and reviewed her imaging studies with her. We have discussed the various treatment options including surgery. I have described the surgical treatment option L4-5 decompression, instrumentation, and fusion. I have shown her surgical models. We have discussed the risks, benefits, alternatives, expected postoperative course, and likelihood of achieving our goals with surgery. I have answered all the patient's questions. She has decided to proceed with surgery.   Rodric Punch D 05/27/2016 7:18 AM

## 2016-05-27 NOTE — Op Note (Signed)
Brief history: The patient is a 65 year old white female who has complained of back and right leg pain consistent with neurogenic claudication. She has failed medical management and was worked up with lumbar x-rays and a lumbar MRI. This demonstrated an L4-5 spondylolisthesis with foraminal stenosis. I discussed the various treatment options with the patient. She has decided to proceed with surgery after weighing the risks, benefits, and alternatives.  Preoperative diagnosis: L4-5 spondylolisthesis, Degenerative disc disease, spinal stenosis compressing the L4 nerve roots; lumbago; lumbar radiculopathy  Postoperative diagnosis: The same   Procedure: L4 laminectomy/Gill procedure/foraminotomies to decompress the bilateral L4 and L5 nerve roots(the work required to do this was in addition to the work required to do the posterior lumbar interbody fusion because of the patient's spinal stenosis, facet arthropathy. Etc. requiring a wide decompression of the nerve roots.); L4-5 transforaminal lumbar interbody fusion with local morselized autograft bone and Kinnex graft extender; insertion of interbody prosthesis at L4-5 (globus peek expandable interbody prosthesis); posterior nonsegmental instrumentation from L4 to L5 with globus titanium pedicle screws and rods; posterior lateral arthrodesis at L4-5 with local morselized autograft bone and Kinnex bone graft extender.  Surgeon: Dr. Earle Gell  Asst.: Dr. Sherley Bounds  Anesthesia: Gen. endotracheal  Estimated blood loss: 150 mL  Drains: None   Complications: None  Description of procedure: The patient was brought to the operating room by the anesthesia team. General endotracheal anesthesia was induced. The patient was turned to the prone position on the Wilson frame. The patient's lumbosacral region was then prepared with Betadine scrub and Betadine solution. Sterile drapes were applied.  I then injected the area to be incised with Marcaine with  epinephrine solution. I then used the scalpel to make a linear midline incision over the L4-5 interspace. I then used electrocautery to perform a bilateral subperiosteal dissection exposing the spinous process and lamina of L3, L4 and L5. We then obtained intraoperative radiograph to confirm our location. We then inserted the Verstrac retractor to provide exposure. As expected the L4 lamina was "free-floating" and  I began the decompression by incising the interspinous ligament at its L3-4 and L4-5.Marland Kitchen We then used the Leksell rongeur to remove the L4 spinous process, lamina and inferior facets . We removed the ligamentum flavum at L3-4 and L4-5 with the Kerrison punches. We performed wide foraminotomies about the bilateral L4 and L5 nerve roots completing the decompression.  We now turned our attention to the posterior lumbar interbody fusion. I used a scalpel to incise the intervertebral disc at L4-5 bilaterally. I then performed a partial intervertebral discectomy at L4-5 bilaterally using the pituitary forceps. We prepared the vertebral endplates at 075-GRM bilaterally for the fusion by removing the soft tissues with the curettes. We then used the trial spacers to pick the appropriate sized interbody prosthesis. We prefilled his prosthesis with a combination of local morselized autograft bone that we obtained during the decompression as well as Kinnex bone graft extender. We inserted the prefilled prosthesis into the interspace at L4-5 from the right, we then expanded the prosthesis. There was a good snug fit of the prosthesis in the interspace. We then filled and the remainder of the intervertebral disc space with local morselized autograft bone and Kinnex. This completed the posterior lumbar interbody arthrodesis.  We now turned attention to the instrumentation. Under fluoroscopic guidance we cannulated the bilateral L4 and L5 pedicles with the bone probe. We then removed the bone probe. We then tapped the  pedicle with a 6.5 millimeter  tap. We then removed the tap. We probed inside the tapped pedicle with a ball probe to rule out cortical breaches. We then inserted a 7.5 x 45 millimeter pedicle screw into the L4 and L5 pedicles bilaterally under fluoroscopic guidance. We then palpated along the medial aspect of the pedicles to rule out cortical breaches. There were none. The nerve roots were not injured. We then connected the unilateral pedicle screws with a lordotic rod. We compressed the construct and secured the rod in place with the caps. We then tightened the caps appropriately. This completed the instrumentation from L4-5.  We now turned our attention to the posterior lateral arthrodesis at L4-5. We used the high-speed drill to decorticate the remainder of the facets, pars, transverse process at L4-5. We then applied a combination of local morselized autograft bone and Kinnex bone graft extender over these decorticated posterior lateral structures. This completed the posterior lateral arthrodesis.  We then obtained hemostasis using bipolar electrocautery. We irrigated the wound out with bacitracin solution. We inspected the thecal sac and nerve roots and noted they were well decompressed. We then removed the retractor. We placed vancomycin powder in the wound. We reapproximated patient's thoracolumbar fascia with interrupted #1 Vicryl suture. We reapproximated patient's subcutaneous tissue with interrupted 2-0 Vicryl suture. The reapproximated patient's skin with Steri-Strips and benzoin. The wound was then coated with bacitracin ointment. A sterile dressing was applied. The drapes were removed. The patient was subsequently returned to the supine position where they were extubated by the anesthesia team. He was then transported to the post anesthesia care unit in stable condition. All sponge instrument and needle counts were reportedly correct at the end of this case.

## 2016-05-27 NOTE — Progress Notes (Signed)
Subjective:  The patient is somnolent but arousable. She is in no apparent distress. She looks well.  Objective: Vital signs in last 24 hours: Temp:  [97.8 F (36.6 C)-98.5 F (36.9 C)] 97.8 F (36.6 C) (09/07 1025) Pulse Rate:  [80] 80 (09/07 0617) Resp:  [18-20] 18 (09/07 1025) BP: (170)/(88) 170/88 (09/07 0617) SpO2:  [95 %] 95 % (09/07 0617) Weight:  [52.5 kg (115 lb 11.2 oz)] 52.5 kg (115 lb 11.2 oz) (09/07 0617)  Intake/Output from previous day: No intake/output data recorded. Intake/Output this shift: Total I/O In: 1600 [I.V.:1600] Out: 450 [Urine:400; Blood:50]  Physical exam the patient is somnolent but arousable. She is moving her lower extremities well.  Lab Results: No results for input(s): WBC, HGB, HCT, PLT in the last 72 hours. BMET No results for input(s): NA, K, CL, CO2, GLUCOSE, BUN, CREATININE, CALCIUM in the last 72 hours.  Studies/Results: Dg Lumbar Spine 1 View  Result Date: 05/27/2016 CLINICAL DATA:  Localization film. Patient for L4-5 laminectomy and fusion. EXAM: LUMBAR SPINE - 1 VIEW COMPARISON:  None. FINDINGS: Single intraoperative lateral view of the lumbar spine is provided. A single probe is in place at the level of the L5 pedicles. IMPRESSION: Localization as above. Electronically Signed   By: Inge Rise M.D.   On: 05/27/2016 10:12    Assessment/Plan: The patient is doing well.  LOS: 0 days     Deniya Craigo D 05/27/2016, 10:33 AM

## 2016-05-27 NOTE — Transfer of Care (Signed)
Immediate Anesthesia Transfer of Care Note  Patient: Isabella Rojas  Procedure(s) Performed: Procedure(s): LUMBAR FOUR-FIVE POSTERIOR LUMBAR INTERBODY FUSION 1 LEVEL,INTERBOBY PROSTHESIS, POSTERIOR NON-SEMENTAL INSTRUMENTATION, POSTERIOR LATERAL ARTHRODESIS (N/A)  Patient Location: PACU  Anesthesia Type:General  Level of Consciousness: awake, oriented, sedated and patient cooperative  Airway & Oxygen Therapy: Patient Spontanous Breathing and Patient connected to face mask oxygen  Post-op Assessment: Report given to RN, Post -op Vital signs reviewed and stable, Patient moving all extremities and Patient moving all extremities X 4  Post vital signs: Reviewed  Last Vitals:  Vitals:   05/27/16 0617 05/27/16 1025  BP: (!) 170/88   Pulse: 80   Resp: 20 18  Temp: 36.9 C 36.6 C    Last Pain:  Vitals:   05/27/16 0617  TempSrc: Oral  PainSc:          Complications: No apparent anesthesia complications

## 2016-05-27 NOTE — Anesthesia Procedure Notes (Signed)
Procedure Name: Intubation Date/Time: 05/27/2016 7:32 AM Performed by: Mariea Clonts Pre-anesthesia Checklist: Patient identified, Emergency Drugs available, Suction available, Patient being monitored and Timeout performed Patient Re-evaluated:Patient Re-evaluated prior to inductionOxygen Delivery Method: Circle system utilized Preoxygenation: Pre-oxygenation with 100% oxygen Intubation Type: IV induction Ventilation: Mask ventilation without difficulty and Oral airway inserted - appropriate to patient size Laryngoscope Size: Mac and 3 Grade View: Grade I Tube type: Oral Tube size: 7.0 mm Number of attempts: 1 Airway Equipment and Method: Stylet Secured at: 20 cm Tube secured with: Tape Dental Injury: Teeth and Oropharynx as per pre-operative assessment  Comments: Placed by Harless Nakayama SRNA

## 2016-05-28 LAB — CBC
HCT: 30.2 % — ABNORMAL LOW (ref 36.0–46.0)
Hemoglobin: 9.8 g/dL — ABNORMAL LOW (ref 12.0–15.0)
MCH: 28.3 pg (ref 26.0–34.0)
MCHC: 32.5 g/dL (ref 30.0–36.0)
MCV: 87.3 fL (ref 78.0–100.0)
PLATELETS: 323 10*3/uL (ref 150–400)
RBC: 3.46 MIL/uL — ABNORMAL LOW (ref 3.87–5.11)
RDW: 14.5 % (ref 11.5–15.5)
WBC: 11 10*3/uL — AB (ref 4.0–10.5)

## 2016-05-28 LAB — BASIC METABOLIC PANEL
ANION GAP: 8 (ref 5–15)
BUN: 7 mg/dL (ref 6–20)
CALCIUM: 8.8 mg/dL — AB (ref 8.9–10.3)
CO2: 27 mmol/L (ref 22–32)
CREATININE: 0.6 mg/dL (ref 0.44–1.00)
Chloride: 104 mmol/L (ref 101–111)
Glucose, Bld: 118 mg/dL — ABNORMAL HIGH (ref 65–99)
Potassium: 4.2 mmol/L (ref 3.5–5.1)
Sodium: 139 mmol/L (ref 135–145)

## 2016-05-28 MED ORDER — OXYCODONE-ACETAMINOPHEN 5-325 MG PO TABS: 1.0000 | ORAL_TABLET | ORAL | 0 refills | Status: DC | PRN

## 2016-05-28 MED ORDER — DOCUSATE SODIUM 100 MG PO CAPS: 100.0000 mg | ORAL_CAPSULE | Freq: Two times a day (BID) | ORAL | 0 refills | Status: DC

## 2016-05-28 MED ORDER — CYCLOBENZAPRINE HCL 10 MG PO TABS: 10.0000 mg | ORAL_TABLET | Freq: Three times a day (TID) | ORAL | 1 refills | Status: AC | PRN

## 2016-05-28 MED ORDER — CYCLOBENZAPRINE HCL 10 MG PO TABS: 10.0000 mg | ORAL_TABLET | Freq: Three times a day (TID) | ORAL | Status: DC | PRN

## 2016-05-28 MED FILL — Heparin Sodium (Porcine) Inj 1000 Unit/ML: INTRAMUSCULAR | Qty: 30 | Status: AC

## 2016-05-28 MED FILL — Sodium Chloride IV Soln 0.9%: INTRAVENOUS | Qty: 1000 | Status: AC

## 2016-05-28 NOTE — Evaluation (Signed)
Physical Therapy Evaluation Patient Details Name: Isabella Rojas MRN: [id number] DOB: 02/10/1951 Today's Date: 05/28/2016   History of Present Illness  patient os a 65 yo female s/p L4-5 decompression, instrumentation, and fusion on the patient on 05/27/2016  Clinical Impression  Patient seen for education and mobility assessment s/p spinal surgery. Patient mobilizing well, education complete, no further acute needs. Will sign off.    Follow Up Recommendations No PT follow up    Equipment Recommendations  3in1 (PT);Cane    Recommendations for Other Services       Precautions / Restrictions Precautions Precautions: Back Precaution Booklet Issued: Yes (comment) Precaution Comments: reviewed Required Braces or Orthoses: Spinal Brace Spinal Brace: Lumbar corset Restrictions Weight Bearing Restrictions: No      Mobility  Bed Mobility Overal bed mobility: Modified Independent             General bed mobility comments: good technique  Transfers Overall transfer level: Modified independent Equipment used: Straight cane             General transfer comment: safe and steady with cane  Ambulation/Gait Ambulation/Gait assistance: Modified independent (Device/Increase time) Ambulation Distance (Feet): 180 Feet Assistive device: Straight cane Gait Pattern/deviations: Step-through pattern;Decreased stride length     General Gait Details: steady with use of cane, short stride, no cues or physical assist required  Stairs            Wheelchair Mobility    Modified Rankin (Stroke Patients Only)       Balance Overall balance assessment: No apparent balance deficits (not formally assessed)                                           Pertinent Vitals/Pain Pain Assessment: 0-10 Pain Score: 6  Pain Location: back Pain Descriptors / Indicators: Sore Pain Intervention(s): Monitored during session    Home Living Family/patient expects to  be discharged to:: Private residence Living Arrangements: Alone Available Help at Discharge: Neighbor Type of Home: Apartment Home Access: Level entry     Home Layout: One level Home Equipment: None      Prior Function Level of Independence: Independent               Hand Dominance   Dominant Hand: Right    Extremity/Trunk Assessment   Upper Extremity Assessment: Overall WFL for tasks assessed           Lower Extremity Assessment: Overall WFL for tasks assessed         Communication   Communication: No difficulties  Cognition Arousal/Alertness: Awake/alert Behavior During Therapy: WFL for tasks assessed/performed Overall Cognitive Status: Within Functional Limits for tasks assessed                      General Comments      Exercises        Assessment/Plan    PT Assessment Patent does not need any further PT services  PT Diagnosis Difficulty walking;Acute pain   PT Problem List    PT Treatment Interventions     PT Goals (Current goals can be found in the Care Plan section) Acute Rehab PT Goals PT Goal Formulation: All assessment and education complete, DC therapy    Frequency     Barriers to discharge        Co-evaluation  End of Session Equipment Utilized During Treatment: Back brace Activity Tolerance: Patient tolerated treatment well Patient left: in bed;with call bell/phone within reach (sitting EOB) Nurse Communication: Mobility status    Functional Assessment Tool Used: clinical judgement Functional Limitation: Mobility: Walking and moving around Mobility: Walking and Moving Around Current Status [id number]): At least 1 percent but less than 20 percent impaired, limited or restricted Mobility: Walking and Moving Around Goal Status [phone]): At least 1 percent but less than 20 percent impaired, limited or restricted Mobility: Walking and Moving Around Discharge Status [phone]): At least 1 percent but less than  20 percent impaired, limited or restricted    Time: 0804-0821 PT Time Calculation (min) (ACUTE ONLY): 17 min   Charges:   PT Evaluation $PT Eval Low Complexity: 1 Procedure     PT G Codes:   PT G-Codes **NOT FOR INPATIENT CLASS** Functional Assessment Tool Used: clinical judgement Functional Limitation: Mobility: Walking and moving around Mobility: Walking and Moving Around Current Status [id number]): At least 1 percent but less than 20 percent impaired, limited or restricted Mobility: Walking and Moving Around Goal Status [phone]): At least 1 percent but less than 20 percent impaired, limited or restricted Mobility: Walking and Moving Around Discharge Status [phone]): At least 1 percent but less than 20 percent impaired, limited or restricted    Isabella Rojas 05/28/2016, 9:28 AM Alben Deeds, PT DPT  [phone]

## 2016-05-28 NOTE — Discharge Instructions (Signed)

## 2016-05-28 NOTE — Progress Notes (Signed)
Equipments ordered per PT rec'd. Awaiting for equipments to arrive. Patient's family will pick pt up at 64PM tonight.  Ave Filter, RN

## 2016-05-28 NOTE — Discharge Summary (Signed)
Physician Discharge Summary  Patient ID: Isabella BellsJennifer Rojas MRN: [id number] DOB/AGE: October 20, 1950 65 y.o.  Admit date: 05/27/2016 Discharge date: 05/28/2016  Admission Diagnoses:L4-5 spondylolisthesis, stenosis, lumbago, lumbar radiculopathy, neurogenic claudication  Discharge Diagnoses: The same Active Problems:   Spondylolisthesis of lumbar region   Discharged Condition: good  Hospital Course: I performed an L4-5 decompression, instrumentation, and fusion on the patient on 05/27/2016. The surgery went well.  The patient's postoperative course was unremarkable. On postoperative day #1 she requested discharge home. She was given written and oral discharge instructions. All her questions were answered.  Consults: Physical therapy Significant Diagnostic Studies: None Treatments: L4-5 decompression, instrumentation, and fusion. Discharge Exam: Blood pressure 124/60, pulse 66, temperature 98.2 F (36.8 C), temperature source Oral, resp. rate 18, height 4' 11.5" (1.511 m), weight 52.5 kg (115 lb 11.2 oz), SpO2 100 %. The patient is alert and pleasant. She looks well. Her strength is grossly normal in her lower extremities.  Disposition: Home  Discharge Instructions    Call MD for:  difficulty breathing, headache or visual disturbances    Complete by:  As directed   Call MD for:  extreme fatigue    Complete by:  As directed   Call MD for:  hives    Complete by:  As directed   Call MD for:  persistant dizziness or light-headedness    Complete by:  As directed   Call MD for:  persistant nausea and vomiting    Complete by:  As directed   Call MD for:  redness, tenderness, or signs of infection (pain, swelling, redness, odor or green/yellow discharge around incision site)    Complete by:  As directed   Call MD for:  severe uncontrolled pain    Complete by:  As directed   Call MD for:  temperature >100.4    Complete by:  As directed   Diet - low sodium heart healthy    Complete by:  As  directed   Discharge instructions    Complete by:  As directed   Call [phone] for a followup appointment. Take a stool softener while you are using pain medications.   Driving Restrictions    Complete by:  As directed   Do not drive for 2 weeks.   Increase activity slowly    Complete by:  As directed   Lifting restrictions    Complete by:  As directed   Do not lift more than 5 pounds. No excessive bending or twisting.   May shower / Bathe    Complete by:  As directed   He may shower after the pain she is removed 3 days after surgery. Leave the incision alone.   Remove dressing in 48 hours    Complete by:  As directed   Your stitches are under the scan and will dissolve by themselves. The Steri-Strips will fall off after you take a few showers. Do not rub back or pick at the wound, Leave the wound alone.       Medication List    TAKE these medications   aspirin EC 81 MG tablet Take 81 mg by mouth daily.   cyclobenzaprine 10 MG tablet Commonly known as:  FLEXERIL Take 1 tablet (10 mg total) by mouth 3 (three) times daily as needed for muscle spasms.   DEXILANT 30 MG capsule Generic drug:  Dexlansoprazole Take 30 mg by mouth daily.   docusate sodium 100 MG capsule Commonly known as:  COLACE Take 1 capsule (100 mg total) by  mouth 2 (two) times daily.   oxyCODONE-acetaminophen 5-325 MG tablet Commonly known as:  PERCOCET/ROXICET Take 1-2 tablets by mouth every 4 (four) hours as needed for severe pain.        SignedNewman Pies D 05/28/2016, 7:50 AM

## 2016-05-28 NOTE — Progress Notes (Signed)
Pt doing well. Pt given D/C instructions with Rx's, verbal understanding was provided. Pt's incision is clean and dry with no sign of infection. Pt's IV was removed prior to D/C. Pt D/C'd home via wheelchair @ 1900 per MD order. Pt is stable @ D/C and has no other needs at this time. Holli Humbles, RN

## 2016-05-28 NOTE — Anesthesia Postprocedure Evaluation (Signed)
Anesthesia Post Note  Patient: Isabella Rojas  Procedure(s) Performed: Procedure(s) (LRB): LUMBAR FOUR-FIVE POSTERIOR LUMBAR INTERBODY FUSION 1 LEVEL,INTERBOBY PROSTHESIS, POSTERIOR NON-SEMENTAL INSTRUMENTATION, POSTERIOR LATERAL ARTHRODESIS (N/A)  Patient location during evaluation: PACU Anesthesia Type: General Level of consciousness: awake Pain management: pain level controlled Vital Signs Assessment: post-procedure vital signs reviewed and stable Respiratory status: spontaneous breathing Cardiovascular status: stable Postop Assessment: no signs of nausea or vomiting Anesthetic complications: no    Last Vitals:  Vitals:   05/28/16 0745 05/28/16 1236  BP: 124/60 115/63  Pulse: 66 72  Resp: 18 18  Temp: 36.8 C 37.3 C    Last Pain:  Vitals:   05/28/16 1236  TempSrc: Oral  PainSc:                  [name]

## 2016-06-15 DIAGNOSIS — M4316 Spondylolisthesis, lumbar region: Secondary | ICD-10-CM | POA: Diagnosis not present

## 2016-06-22 ENCOUNTER — Encounter (HOSPITAL_COMMUNITY): Payer: Self-pay | Admitting: Neurosurgery

## 2016-06-22 NOTE — OR Nursing (Signed)
Addendum created. Correction made to dates in OR times.

## 2016-07-07 DIAGNOSIS — M62838 Other muscle spasm: Secondary | ICD-10-CM | POA: Diagnosis not present

## 2016-08-26 DIAGNOSIS — E559 Vitamin D deficiency, unspecified: Secondary | ICD-10-CM | POA: Diagnosis not present

## 2016-08-26 DIAGNOSIS — E785 Hyperlipidemia, unspecified: Secondary | ICD-10-CM | POA: Diagnosis not present

## 2016-08-26 DIAGNOSIS — R300 Dysuria: Secondary | ICD-10-CM | POA: Diagnosis not present

## 2016-10-12 DIAGNOSIS — M4316 Spondylolisthesis, lumbar region: Secondary | ICD-10-CM | POA: Diagnosis not present

## 2016-10-14 DIAGNOSIS — F320 Major depressive disorder, single episode, mild: Secondary | ICD-10-CM | POA: Diagnosis not present

## 2016-10-14 DIAGNOSIS — R9431 Abnormal electrocardiogram [ECG] [EKG]: Secondary | ICD-10-CM | POA: Diagnosis not present

## 2016-10-14 DIAGNOSIS — K219 Gastro-esophageal reflux disease without esophagitis: Secondary | ICD-10-CM | POA: Diagnosis not present

## 2016-10-14 DIAGNOSIS — E782 Mixed hyperlipidemia: Secondary | ICD-10-CM | POA: Diagnosis not present

## 2016-10-14 DIAGNOSIS — R008 Other abnormalities of heart beat: Secondary | ICD-10-CM | POA: Diagnosis not present

## 2016-10-14 DIAGNOSIS — R300 Dysuria: Secondary | ICD-10-CM | POA: Diagnosis not present

## 2016-11-23 DIAGNOSIS — I499 Cardiac arrhythmia, unspecified: Secondary | ICD-10-CM | POA: Diagnosis not present

## 2016-11-23 DIAGNOSIS — R9431 Abnormal electrocardiogram [ECG] [EKG]: Secondary | ICD-10-CM | POA: Diagnosis not present

## 2016-11-24 DIAGNOSIS — R9431 Abnormal electrocardiogram [ECG] [EKG]: Secondary | ICD-10-CM | POA: Diagnosis not present

## 2016-12-06 DIAGNOSIS — R300 Dysuria: Secondary | ICD-10-CM | POA: Diagnosis not present

## 2016-12-06 DIAGNOSIS — E782 Mixed hyperlipidemia: Secondary | ICD-10-CM | POA: Diagnosis not present

## 2016-12-06 DIAGNOSIS — N952 Postmenopausal atrophic vaginitis: Secondary | ICD-10-CM | POA: Diagnosis not present

## 2016-12-06 DIAGNOSIS — K219 Gastro-esophageal reflux disease without esophagitis: Secondary | ICD-10-CM | POA: Diagnosis not present

## 2017-01-03 DIAGNOSIS — N644 Mastodynia: Secondary | ICD-10-CM | POA: Diagnosis not present

## 2017-01-03 DIAGNOSIS — R922 Inconclusive mammogram: Secondary | ICD-10-CM | POA: Diagnosis not present

## 2017-01-05 DIAGNOSIS — R922 Inconclusive mammogram: Secondary | ICD-10-CM | POA: Diagnosis not present

## 2017-01-05 DIAGNOSIS — N644 Mastodynia: Secondary | ICD-10-CM | POA: Diagnosis not present

## 2017-03-09 DIAGNOSIS — K219 Gastro-esophageal reflux disease without esophagitis: Secondary | ICD-10-CM | POA: Diagnosis not present

## 2017-03-09 DIAGNOSIS — E782 Mixed hyperlipidemia: Secondary | ICD-10-CM | POA: Diagnosis not present

## 2017-03-09 DIAGNOSIS — B351 Tinea unguium: Secondary | ICD-10-CM | POA: Diagnosis not present

## 2017-03-24 ENCOUNTER — Ambulatory Visit: Payer: Self-pay | Admitting: Sports Medicine

## 2017-04-14 ENCOUNTER — Ambulatory Visit (INDEPENDENT_AMBULATORY_CARE_PROVIDER_SITE_OTHER): Payer: Medicare HMO | Admitting: Sports Medicine

## 2017-04-14 ENCOUNTER — Ambulatory Visit: Payer: Self-pay | Admitting: Sports Medicine

## 2017-04-14 ENCOUNTER — Encounter: Payer: Self-pay | Admitting: Sports Medicine

## 2017-04-14 VITALS — Ht 61.0 in | Wt 118.0 lb

## 2017-04-14 DIAGNOSIS — M79674 Pain in right toe(s): Secondary | ICD-10-CM | POA: Diagnosis not present

## 2017-04-14 DIAGNOSIS — L602 Onychogryphosis: Secondary | ICD-10-CM

## 2017-04-14 DIAGNOSIS — M79675 Pain in left toe(s): Secondary | ICD-10-CM | POA: Diagnosis not present

## 2017-04-14 NOTE — Progress Notes (Signed)
Subjective: Isabella Rojas is a 66 y.o. female patient seen today in office with complaint of painful thickened and discolored nails R>L 1st toes. Patient is desiring treatment for nail changes but does not want to take oral medication of Lamisil as given by her PCP. Reports that nails are becoming difficult to manage because of the thickness. Patient has no other pedal complaints at this time.   Patient Active Problem List   Diagnosis Date Noted  . Spondylolisthesis of lumbar region 05/27/2016    Current Outpatient Prescriptions on File Prior to Visit  Medication Sig Dispense Refill  . cyclobenzaprine (FLEXERIL) 10 MG tablet Take 1 tablet (10 mg total) by mouth 3 (three) times daily as needed for muscle spasms. 50 tablet 1  . DEXILANT 30 MG capsule Take 30 mg by mouth daily.  3  . oxyCODONE-acetaminophen (PERCOCET/ROXICET) 5-325 MG tablet Take 1-2 tablets by mouth every 4 (four) hours as needed for severe pain. 50 tablet 0  . aspirin EC 81 MG tablet Take 81 mg by mouth daily.    Marland Kitchen docusate sodium (COLACE) 100 MG capsule Take 1 capsule (100 mg total) by mouth 2 (two) times daily. (Patient not taking: Reported on 04/14/2017) 60 capsule 0   No current facility-administered medications on file prior to visit.     Allergies  Allergen Reactions  . Zoloft [Sertraline Hcl] Palpitations    "I started sweating and I felt like I was going to have a heart attack"    Objective: Physical Exam  General: Well developed, nourished, no acute distress, awake, alert and oriented x 3  Vascular: Dorsalis pedis artery 2/4 bilateral, Posterior tibial artery 1/4 bilateral, skin temperature warm to warm proximal to distal bilateral lower extremities, no varicosities, pedal hair present bilateral.  Neurological: Gross sensation present via light touch bilateral.   Dermatological: Skin is warm, dry, and supple bilateral, Nails 1-10 are tender, short thick, and discolored with severe subungal debris with  Right>Left 1st toe with trauma lines and rams horn appearence, no webspace macerations present bilateral, no open lesions present bilateral, no callus/corns/hyperkeratotic tissue present bilateral. No signs of infection bilateral.  Musculoskeletal: +pain to nails at 1st toes. No other symptomatic boney deformities noted bilateral. Muscular strength within normal limits without painon range of motion. No pain with calf compression bilateral.  Assessment and Plan:  Problem List Items Addressed This Visit    None    Visit Diagnoses    Onychogryposis of toenail    -  Primary   R>L hallux nails   Toe pain, bilateral          -Examined patient -Discussed treatment options for painful dystrophic nails  -Patient opts for nail avulsion but wants to plan it at a later time to do the left side 1st. -Patient to return for nail procedure when she is ready or sooner if symptoms worsen.  Landis Martins, DPM

## 2017-04-14 NOTE — Progress Notes (Signed)
   Subjective:    Patient ID: Isabella Rojas, female    DOB: December 25, 1950, 67 y.o.   MRN: [id number]  HPI   My big toe nails have been changing for several years they have become thick discolored and rippling.  I do not want to take any pills.    Review of Systems  Constitutional: Positive for appetite change and fatigue.  All other systems reviewed and are negative.      Objective:   Physical Exam        Assessment & Plan:

## 2017-04-29 DIAGNOSIS — R030 Elevated blood-pressure reading, without diagnosis of hypertension: Secondary | ICD-10-CM | POA: Diagnosis not present

## 2017-04-29 DIAGNOSIS — M4316 Spondylolisthesis, lumbar region: Secondary | ICD-10-CM | POA: Diagnosis not present

## 2017-05-06 ENCOUNTER — Ambulatory Visit (INDEPENDENT_AMBULATORY_CARE_PROVIDER_SITE_OTHER): Payer: Medicare HMO | Admitting: Sports Medicine

## 2017-05-06 ENCOUNTER — Encounter: Payer: Self-pay | Admitting: Sports Medicine

## 2017-05-06 VITALS — BP 139/72 | HR 65

## 2017-05-06 DIAGNOSIS — L602 Onychogryphosis: Secondary | ICD-10-CM | POA: Diagnosis not present

## 2017-05-06 DIAGNOSIS — M79675 Pain in left toe(s): Secondary | ICD-10-CM

## 2017-05-06 DIAGNOSIS — M79674 Pain in right toe(s): Secondary | ICD-10-CM

## 2017-05-06 NOTE — Progress Notes (Signed)
Subjective: Isabella Rojas is a 66 y.o. female patient seen today for discolored and thickened nails; patient desires to have nail removal. Will do the left 1st toe nail today. Denies any other complaints.   Patient Active Problem List   Diagnosis Date Noted  . Spondylolisthesis of lumbar region 05/27/2016    Current Outpatient Prescriptions on File Prior to Visit  Medication Sig Dispense Refill  . aspirin EC 81 MG tablet Take 81 mg by mouth daily.    . cyclobenzaprine (FLEXERIL) 10 MG tablet Take 1 tablet (10 mg total) by mouth 3 (three) times daily as needed for muscle spasms. 50 tablet 1  . DEXILANT 30 MG capsule Take 30 mg by mouth daily.  3  . docusate sodium (COLACE) 100 MG capsule Take 1 capsule (100 mg total) by mouth 2 (two) times daily. (Patient not taking: Reported on 04/14/2017) 60 capsule 0  . oxyCODONE-acetaminophen (PERCOCET/ROXICET) 5-325 MG tablet Take 1-2 tablets by mouth every 4 (four) hours as needed for severe pain. 50 tablet 0  . rosuvastatin (CRESTOR) 20 MG tablet Take 20 mg by mouth at bedtime.  2   No current facility-administered medications on file prior to visit.     Allergies  Allergen Reactions  . Zoloft [Sertraline Hcl] Palpitations    "I started sweating and I felt like I was going to have a heart attack"    Objective: Physical Exam  General: Well developed, nourished, no acute distress, awake, alert and oriented x 3  Vascular: Dorsalis pedis artery 2/4 bilateral, Posterior tibial artery 1/4 bilateral, skin temperature warm to warm proximal to distal bilateral lower extremities, no varicosities, pedal hair present bilateral.  Neurological: Gross sensation present via light touch bilateral.   Dermatological: Skin is warm, dry, and supple bilateral, Nails 1-10 are tender, short thick, and discolored with severe subungal debris with Right>Left 1st toe with trauma lines and rams horn appearence, no webspace macerations present bilateral, no open  lesions present bilateral, no callus/corns/hyperkeratotic tissue present bilateral. No signs of infection bilateral.  Musculoskeletal: +pain to nails at 1st toes. No other symptomatic boney deformities noted bilateral. Muscular strength within normal limits without painon range of motion. No pain with calf compression bilateral.  Assessment and Plan:  Problem List Items Addressed This Visit    None    Visit Diagnoses    Onychogryposis of toenail    -  Primary   Toe pain, bilateral         -Examined patient -Discussed treatment options for painful dystrophic nails  Discussed treatment alternatives and plan of care; Explained permanent/temporary nail avulsion and post procedure course to patient. Patient opt for temp removal of left hallux nail today - After a verbal consent, injected 3 ml of a 50:50 mixture of 2% plain  lidocaine and 0.5% plain marcaine in a normal hallux block fashion. Next, a  betadine prep was performed. Anesthesia was tested and found to be appropriate.  The left 1st toenail was then incised from the hyponychium to the epinychium. The offending nail was removed and cleared from the field. The area was curretted for any remaining nail or spicules. Area was flushed with alcohol and dressed with antibiotic cream and a dry sterile dressing. -Patient was instructed to leave the dressing intact for today and begin soaking  in a weak solution of betadine or Epsom salt and water tomorrow. Patient was instructed to  soak for 15 minutes each day and apply neosporin and a gauze or bandaid dressing each day. -  Patient was instructed to monitor the toe for signs of infection and return to office if toe becomes red, hot or swollen. -Patient to return in 2-3 weeks for nail check or sooner if symptoms worsen.  Landis Martins, DPM

## 2017-05-06 NOTE — Patient Instructions (Signed)

## 2017-05-27 ENCOUNTER — Ambulatory Visit (INDEPENDENT_AMBULATORY_CARE_PROVIDER_SITE_OTHER): Payer: Self-pay | Admitting: Sports Medicine

## 2017-05-27 ENCOUNTER — Encounter (INDEPENDENT_AMBULATORY_CARE_PROVIDER_SITE_OTHER): Payer: Self-pay

## 2017-05-27 DIAGNOSIS — Z9889 Other specified postprocedural states: Secondary | ICD-10-CM

## 2017-05-27 DIAGNOSIS — M79675 Pain in left toe(s): Secondary | ICD-10-CM

## 2017-05-27 DIAGNOSIS — L602 Onychogryphosis: Secondary | ICD-10-CM

## 2017-05-27 DIAGNOSIS — M79674 Pain in right toe(s): Secondary | ICD-10-CM

## 2017-05-27 NOTE — Progress Notes (Signed)
Subjective: Isabella Rojas is a 66 y.o. female patient returns to office today for follow up evaluation after having Left Hallux total temporary nail avulsion performed on 05-06-17. Patient has been soaking using epsom salt and applying topical antibiotic covered with bandaid daily. Patient deniesfever/chills/nausea/vomitting/any other related constitutional symptoms at this time. Desires to have right 1st toe done next week.   Patient Active Problem List   Diagnosis Date Noted  . Spondylolisthesis of lumbar region 05/27/2016    Current Outpatient Prescriptions on File Prior to Visit  Medication Sig Dispense Refill  . aspirin EC 81 MG tablet Take 81 mg by mouth daily.    . cyclobenzaprine (FLEXERIL) 10 MG tablet Take 1 tablet (10 mg total) by mouth 3 (three) times daily as needed for muscle spasms. 50 tablet 1  . DEXILANT 30 MG capsule Take 30 mg by mouth daily.  3  . docusate sodium (COLACE) 100 MG capsule Take 1 capsule (100 mg total) by mouth 2 (two) times daily. (Patient not taking: Reported on 04/14/2017) 60 capsule 0  . oxyCODONE-acetaminophen (PERCOCET/ROXICET) 5-325 MG tablet Take 1-2 tablets by mouth every 4 (four) hours as needed for severe pain. 50 tablet 0  . rosuvastatin (CRESTOR) 20 MG tablet Take 20 mg by mouth at bedtime.  2   No current facility-administered medications on file prior to visit.     Allergies  Allergen Reactions  . Zoloft [Sertraline Hcl] Palpitations    "I started sweating and I felt like I was going to have a heart attack"    Objective:  General: Well developed, nourished, in no acute distress, alert and oriented x3   Dermatology: Skin is warm, dry and supple bilateral. Left hallux nail bed appears to be clean, dry, with surrounding eschar/scab. (-) Erythema. (-) Edema. (-) serosanguous drainage present. The remaining nails appear unremarkable at this time except the right 1st toe where nail is thickened with Rams horn appearance. There are no other  lesions or other signs of infection present.  Neurovascular status: Intact. No lower extremity swelling; No pain with calf compression bilateral.  Musculoskeletal: No tenderness to palpation of the Left hallux nail bed. Muscular strength within normal limits bilateral.   Assesement and Plan: Problem List Items Addressed This Visit    None    Visit Diagnoses    S/P nail surgery    -  Primary   Toe pain, bilateral       Onychogryposis of toenail         -Examined patient  -Left procedure site is well healed -Educated patient on long term care after nail surgery. -Patient was instructed to monitor the toe for reoccurrence and signs of infection; Patient advised to return to office or go to ER if toe becomes red, hot or swollen. -Patient desires to have Right 1st toenail removed next week -Patient to return for right nail procedure when ready or sooner if problems arise.  Landis Martins, DPM

## 2017-06-08 ENCOUNTER — Encounter (INDEPENDENT_AMBULATORY_CARE_PROVIDER_SITE_OTHER): Payer: Self-pay

## 2017-06-08 ENCOUNTER — Ambulatory Visit (INDEPENDENT_AMBULATORY_CARE_PROVIDER_SITE_OTHER): Payer: Medicare HMO | Admitting: Sports Medicine

## 2017-06-08 DIAGNOSIS — L602 Onychogryphosis: Secondary | ICD-10-CM

## 2017-06-08 DIAGNOSIS — M79674 Pain in right toe(s): Secondary | ICD-10-CM

## 2017-06-08 NOTE — Progress Notes (Signed)
Subjective: Isabella Rojas is a 66 y.o. female patient seen today for discolored and thickened right 1st toenail; patient desires to have nail removed today. Denies any other complaints.   Patient Active Problem List   Diagnosis Date Noted  . Spondylolisthesis of lumbar region 05/27/2016    Current Outpatient Prescriptions on File Prior to Visit  Medication Sig Dispense Refill  . aspirin EC 81 MG tablet Take 81 mg by mouth daily.    . cyclobenzaprine (FLEXERIL) 10 MG tablet Take 1 tablet (10 mg total) by mouth 3 (three) times daily as needed for muscle spasms. 50 tablet 1  . DEXILANT 30 MG capsule Take 30 mg by mouth daily.  3  . docusate sodium (COLACE) 100 MG capsule Take 1 capsule (100 mg total) by mouth 2 (two) times daily. (Patient not taking: Reported on 04/14/2017) 60 capsule 0  . oxyCODONE-acetaminophen (PERCOCET/ROXICET) 5-325 MG tablet Take 1-2 tablets by mouth every 4 (four) hours as needed for severe pain. 50 tablet 0  . rosuvastatin (CRESTOR) 20 MG tablet Take 20 mg by mouth at bedtime.  2   No current facility-administered medications on file prior to visit.     Allergies  Allergen Reactions  . Zoloft [Sertraline Hcl] Palpitations    "I started sweating and I felt like I was going to have a heart attack"    Objective: Physical Exam  General: Well developed, nourished, no acute distress, awake, alert and oriented x 3  Vascular: Dorsalis pedis artery 2/4 bilateral, Posterior tibial artery 1/4 bilateral, skin temperature warm to warm proximal to distal bilateral lower extremities, no varicosities, pedal hair present bilateral.  Neurological: Gross sensation present via light touch bilateral.   Dermatological: Skin is warm, dry, and supple bilateral, Nails 1-10 are tender, short thick, and discolored with severe subungal debris at Right 1st toe with trauma lines and rams horn appearence, no webspace macerations present bilateral, no open lesions present bilateral, no  callus/corns/hyperkeratotic tissue present bilateral. No signs of infection bilateral.  Musculoskeletal: +pain to right 1st toenail. No other symptomatic boney deformities noted bilateral. Muscular strength within normal limits without pain on range of motion. No pain with calf compression bilateral.  Assessment and Plan:  Problem List Items Addressed This Visit    None    Visit Diagnoses    Onychogryposis of toenail    -  Primary   Toe pain, right         -Examined patient -Discussed treatment options for painful dystrophic nail Discussed treatment alternatives and plan of care; Explained permanent/temporary nail avulsion and post procedure course to patient. Patient opt for temp removal of right hallux nail today - After a verbal consent, injected 3 ml of a 50:50 mixture of 2% plain  lidocaine and 0.5% plain marcaine in a normal hallux block fashion. Next, a  betadine prep was performed. Anesthesia was tested and found to be appropriate.  The right 1st toenail was then incised from the hyponychium to the epinychium. The offending nail was removed and cleared from the field. The area was curretted for any remaining nail or spicules. Area was flushed with alcohol and dressed with antibiotic cream and a dry sterile dressing. -Patient was instructed to leave the dressing intact for today and begin soaking  in a weak solution of betadine or Epsom salt and water tomorrow. Patient was instructed to  soak for 15 minutes each day and apply neosporin and a gauze or bandaid dressing each day. -Patient was instructed to monitor the  toe for signs of infection and return to office if toe becomes red, hot or swollen. -Patient to return in 2 weeks for nail check or sooner if symptoms worsen.  Landis Martins, DPM

## 2017-06-08 NOTE — Patient Instructions (Signed)

## 2017-06-13 DIAGNOSIS — E782 Mixed hyperlipidemia: Secondary | ICD-10-CM | POA: Diagnosis not present

## 2017-06-13 DIAGNOSIS — R10816 Epigastric abdominal tenderness: Secondary | ICD-10-CM | POA: Diagnosis not present

## 2017-06-13 DIAGNOSIS — K219 Gastro-esophageal reflux disease without esophagitis: Secondary | ICD-10-CM | POA: Diagnosis not present

## 2017-06-17 ENCOUNTER — Ambulatory Visit: Payer: Medicare HMO | Admitting: Sports Medicine

## 2017-06-23 ENCOUNTER — Ambulatory Visit (INDEPENDENT_AMBULATORY_CARE_PROVIDER_SITE_OTHER): Payer: Self-pay | Admitting: Sports Medicine

## 2017-06-23 DIAGNOSIS — Z9889 Other specified postprocedural states: Secondary | ICD-10-CM

## 2017-06-23 DIAGNOSIS — M79674 Pain in right toe(s): Secondary | ICD-10-CM

## 2017-06-23 NOTE — Progress Notes (Signed)
Subjective: Isabella Rojas is a 66 y.o. female patient returns to office today for follow up evaluation after having right Hallux total temporary nail avulsion performed on 06/08/2017. Patient has been soaking using epsom salt and applying topical antibiotic covered with bandaid daily. Patient deniesfever/chills/nausea/vomitting/any other related constitutional symptoms at this time.  Patient Active Problem List   Diagnosis Date Noted  . Spondylolisthesis of lumbar region 05/27/2016    Current Outpatient Prescriptions on File Prior to Visit  Medication Sig Dispense Refill  . aspirin EC 81 MG tablet Take 81 mg by mouth daily.    . cyclobenzaprine (FLEXERIL) 10 MG tablet Take 1 tablet (10 mg total) by mouth 3 (three) times daily as needed for muscle spasms. 50 tablet 1  . DEXILANT 30 MG capsule Take 30 mg by mouth daily.  3  . docusate sodium (COLACE) 100 MG capsule Take 1 capsule (100 mg total) by mouth 2 (two) times daily. (Patient not taking: Reported on 04/14/2017) 60 capsule 0  . oxyCODONE-acetaminophen (PERCOCET/ROXICET) 5-325 MG tablet Take 1-2 tablets by mouth every 4 (four) hours as needed for severe pain. 50 tablet 0  . rosuvastatin (CRESTOR) 20 MG tablet Take 20 mg by mouth at bedtime.  2   No current facility-administered medications on file prior to visit.     Allergies  Allergen Reactions  . Zoloft [Sertraline Hcl] Palpitations    "I started sweating and I felt like I was going to have a heart attack"    Objective:  General: Well developed, nourished, in no acute distress, alert and oriented x3   Dermatology: Skin is warm, dry and supple bilateral.Right hallux nail bed appears to be clean, dry, with surrounding eschar/scab. (-) Erythema. (-) Edema. (-) serosanguous drainage present. The remaining nails appear unremarkable at this time. There are no other lesions or other signs of infection present.  Neurovascular status: Intact. No lower extremity swelling; No pain with  calf compression bilateral.  Musculoskeletal: No tenderness to palpation of the Right hallux nail bed. Muscular strength within normal limits bilateral.   Assesement and Plan: Problem List Items Addressed This Visit    None    Visit Diagnoses    S/P nail surgery    -  Primary   Toe pain, right         -Examined patient  -Right procedure site is healing well -Educated patient on long term care after nail surgery. -Patient was instructed to monitor the toe for reoccurrence and signs of infection; Patient advised to return to office or go to ER if toe becomes red, hot or swollen. -Patient to return as needed or sooner if problems arise.  Landis Martins, DPM

## 2017-07-05 DIAGNOSIS — H524 Presbyopia: Secondary | ICD-10-CM | POA: Diagnosis not present

## 2017-07-26 DIAGNOSIS — M7918 Myalgia, other site: Secondary | ICD-10-CM | POA: Diagnosis not present

## 2017-07-26 DIAGNOSIS — M79672 Pain in left foot: Secondary | ICD-10-CM | POA: Diagnosis not present

## 2017-07-28 DIAGNOSIS — M79672 Pain in left foot: Secondary | ICD-10-CM | POA: Diagnosis not present

## 2017-07-28 DIAGNOSIS — R1013 Epigastric pain: Secondary | ICD-10-CM | POA: Diagnosis not present

## 2017-07-28 DIAGNOSIS — S99922A Unspecified injury of left foot, initial encounter: Secondary | ICD-10-CM | POA: Diagnosis not present

## 2017-08-03 DIAGNOSIS — K7689 Other specified diseases of liver: Secondary | ICD-10-CM | POA: Diagnosis not present

## 2017-08-03 DIAGNOSIS — R1013 Epigastric pain: Secondary | ICD-10-CM | POA: Diagnosis not present

## 2017-09-15 DIAGNOSIS — R5383 Other fatigue: Secondary | ICD-10-CM | POA: Diagnosis not present

## 2017-09-15 DIAGNOSIS — E782 Mixed hyperlipidemia: Secondary | ICD-10-CM | POA: Diagnosis not present

## 2017-09-15 DIAGNOSIS — Z6824 Body mass index (BMI) 24.0-24.9, adult: Secondary | ICD-10-CM | POA: Diagnosis not present

## 2017-09-15 DIAGNOSIS — R3121 Asymptomatic microscopic hematuria: Secondary | ICD-10-CM | POA: Diagnosis not present

## 2017-09-15 DIAGNOSIS — N3001 Acute cystitis with hematuria: Secondary | ICD-10-CM | POA: Diagnosis not present

## 2017-09-15 DIAGNOSIS — Z0001 Encounter for general adult medical examination with abnormal findings: Secondary | ICD-10-CM | POA: Diagnosis not present

## 2017-10-06 DIAGNOSIS — I700 Atherosclerosis of aorta: Secondary | ICD-10-CM | POA: Diagnosis not present

## 2017-10-06 DIAGNOSIS — K7689 Other specified diseases of liver: Secondary | ICD-10-CM | POA: Diagnosis not present

## 2017-10-06 DIAGNOSIS — R3129 Other microscopic hematuria: Secondary | ICD-10-CM | POA: Diagnosis not present

## 2017-10-14 ENCOUNTER — Ambulatory Visit (INDEPENDENT_AMBULATORY_CARE_PROVIDER_SITE_OTHER): Payer: Medicare HMO | Admitting: Sports Medicine

## 2017-10-14 ENCOUNTER — Encounter: Payer: Self-pay | Admitting: Sports Medicine

## 2017-10-14 ENCOUNTER — Ambulatory Visit (INDEPENDENT_AMBULATORY_CARE_PROVIDER_SITE_OTHER): Payer: Medicare HMO

## 2017-10-14 DIAGNOSIS — M729 Fibroblastic disorder, unspecified: Secondary | ICD-10-CM | POA: Diagnosis not present

## 2017-10-14 DIAGNOSIS — M79672 Pain in left foot: Secondary | ICD-10-CM

## 2017-10-14 DIAGNOSIS — M21622 Bunionette of left foot: Secondary | ICD-10-CM | POA: Diagnosis not present

## 2017-10-14 DIAGNOSIS — M779 Enthesopathy, unspecified: Secondary | ICD-10-CM

## 2017-10-14 MED ORDER — METHYLPREDNISOLONE 4 MG PO TBPK: ORAL_TABLET | ORAL | 0 refills | Status: DC

## 2017-10-14 NOTE — Patient Instructions (Signed)
For tennis shoes recommend:  Brooks Beast [employer]is New balance [place] Can be purchased at [employer] or [employer]  Vionic  SAS Can be purchased at [employer] or [employer]   For work shoes recommend: Sketchers Work [employer]land boots  Can be purchased at a variety of places or [employer]   For casual shoes recommend: Vionic  Can be purchased at [employer] or [employer]  

## 2017-10-14 NOTE — Progress Notes (Signed)
Subjective: Isabella Rojas is a 67 y.o. female patient who presents to office for evaluation of left foot pain. Patient complains of progressive pain especially over the last few weeks over the bump on her left foot. Reports pain is now interferring with daily activities and is also causing her to have arch pain. Patient has tried wider shoes with some improvement and had to throw away an old pair of sketchers that may have started all of this. Patient denies any other pedal complaints. Denies known injury/trip/fall/sprain/any other causative factors.   Patient Active Problem List   Diagnosis Date Noted  . Spondylolisthesis of lumbar region 05/27/2016    Current Outpatient Medications on File Prior to Visit  Medication Sig Dispense Refill  . aspirin EC 81 MG tablet Take 81 mg by mouth daily.    . cyclobenzaprine (FLEXERIL) 10 MG tablet Take 1 tablet (10 mg total) by mouth 3 (three) times daily as needed for muscle spasms. 50 tablet 1  . DEXILANT 30 MG capsule Take 30 mg by mouth daily.  3  . docusate sodium (COLACE) 100 MG capsule Take 1 capsule (100 mg total) by mouth 2 (two) times daily. (Patient not taking: Reported on 04/14/2017) 60 capsule 0  . oxyCODONE-acetaminophen (PERCOCET/ROXICET) 5-325 MG tablet Take 1-2 tablets by mouth every 4 (four) hours as needed for severe pain. 50 tablet 0  . rosuvastatin (CRESTOR) 20 MG tablet Take 20 mg by mouth at bedtime.  2   No current facility-administered medications on file prior to visit.     Allergies  Allergen Reactions  . Zoloft [Sertraline Hcl] Palpitations    "I started sweating and I felt like I was going to have a heart attack"    Objective:  General: Alert and oriented x3 in no acute distress  Dermatology: Nail procedure sites well healed bilateral. No open lesions bilateral lower extremities, no webspace macerations, no ecchymosis bilateral, all nails x 10 are well manicured.  Vascular: Dorsalis Pedis and Posterior Tibial pedal  pulses palpable, Capillary Fill Time 3 seconds,(+) pedal hair growth bilateral, no edema bilateral lower extremities, Temperature gradient within normal limits.  Neurology: Johney Maine sensation intact via light touch bilateral. (- )Tinels sign bilateral.   Musculoskeletal: Mild tenderness with palpation at left foot at tailor bunion and along arch on left. No pain with calf compression bilateral. Strength within normal limits in all groups bilateral.    Xrays  Left Foot   Impression: Normal osseous mineralization, mild enlargement of 5th metatarsal head with increased IM angle supportive of tailors bunion deformity. No other acute findings.   Assessment and Plan: Problem List Items Addressed This Visit    None    Visit Diagnoses    Tailor's bunion of left foot    -  Primary   Relevant Orders   DG Foot Complete Left   Left foot pain       Relevant Orders   DG Foot Complete Left      -Complete examination performed -Xrays reviewed -Discussed treatement options for tailors bunion and early fasciitis  -Rx Medrol dose pack to take as instructed -Dispensed silicone tailors bunion shield -Recommend new shoes, shoe list provided -Recommend daily stretching and icing as instructed  -Patient to return to office as needed or sooner if condition worsens.  Landis Martins, DPM

## 2017-10-28 DIAGNOSIS — R030 Elevated blood-pressure reading, without diagnosis of hypertension: Secondary | ICD-10-CM | POA: Diagnosis not present

## 2017-10-28 DIAGNOSIS — M545 Low back pain: Secondary | ICD-10-CM | POA: Diagnosis not present

## 2017-10-28 DIAGNOSIS — M4316 Spondylolisthesis, lumbar region: Secondary | ICD-10-CM | POA: Diagnosis not present

## 2017-11-25 ENCOUNTER — Telehealth: Payer: Self-pay | Admitting: *Deleted

## 2017-11-25 ENCOUNTER — Ambulatory Visit (INDEPENDENT_AMBULATORY_CARE_PROVIDER_SITE_OTHER): Payer: Medicare Other | Admitting: Sports Medicine

## 2017-11-25 ENCOUNTER — Encounter: Payer: Self-pay | Admitting: Sports Medicine

## 2017-11-25 DIAGNOSIS — Z01818 Encounter for other preprocedural examination: Secondary | ICD-10-CM | POA: Diagnosis not present

## 2017-11-25 DIAGNOSIS — M79672 Pain in left foot: Secondary | ICD-10-CM

## 2017-11-25 DIAGNOSIS — M722 Plantar fascial fibromatosis: Secondary | ICD-10-CM | POA: Diagnosis not present

## 2017-11-25 DIAGNOSIS — M21622 Bunionette of left foot: Secondary | ICD-10-CM | POA: Diagnosis not present

## 2017-11-25 NOTE — Patient Instructions (Signed)
Pre-Operative Instructions  Congratulations, you have decided to take an important step towards improving your quality of life.  You can be assured that the doctors and staff at [facility] will be with you every step of the way.  Here are some important things you should know:  1. Plan to be at the surgery center/hospital at least 1 (one) hour prior to your scheduled time, unless otherwise directed by the surgical center/hospital staff.  You must have a responsible adult accompany you, remain during the surgery and drive you home.  Make sure you have directions to the surgical center/hospital to ensure you arrive on time. 2. If you are having surgery at [facility] or [facility] hospitals, you will need a copy of your medical history and physical form from your family physician within one month prior to the date of surgery. We will give you a form for your primary physician to complete.  3. We make every effort to accommodate the date you request for surgery.  However, there are times where surgery dates or times have to be moved.  We will contact you as soon as possible if a change in schedule is required.   4. No aspirin/ibuprofen for one week before surgery.  If you are on aspirin, any non-steroidal anti-inflammatory medications (Mobic, Aleve, Ibuprofen) should not be taken seven (7) days prior to your surgery.  You make take Tylenol for pain prior to surgery.  5. Medications - If you are taking daily heart and blood pressure medications, seizure, reflux, allergy, asthma, anxiety, pain or diabetes medications, make sure you notify the surgery center/hospital before the day of surgery so they can tell you which medications you should take or avoid the day of surgery. 6. No food or drink after midnight the night before surgery unless directed otherwise by surgical center/hospital staff. 7. No alcoholic beverages 24-hours prior to surgery.  No smoking 24-hours prior or 24-hours after  surgery. 8. Wear loose pants or shorts. They should be loose enough to fit over bandages, boots, and casts. 9. Don't wear slip-on shoes. Sneakers are preferred. 10. Bring your boot with you to the surgery center/hospital.  Also bring crutches or a walker if your physician has prescribed it for you.  If you do not have this equipment, it will be provided for you after surgery. 11. If you have not been contacted by the surgery center/hospital by the day before your surgery, call to confirm the date and time of your surgery. 12. Leave-time from work may vary depending on the type of surgery you have.  Appropriate arrangements should be made prior to surgery with your employer. 13. Prescriptions will be provided immediately following surgery by your doctor.  Fill these as soon as possible after surgery and take the medication as directed. Pain medications will not be refilled on weekends and must be approved by the doctor. 14. Remove nail polish on the operative foot and avoid getting pedicures prior to surgery. 15. Wash the night before surgery.  The night before surgery wash the foot and leg well with water and the antibacterial soap provided. Be sure to pay special attention to beneath the toenails and in between the toes.  Wash for at least three (3) minutes. Rinse thoroughly with water and dry well with a towel.  Perform this wash unless told not to do so by your physician.  Enclosed: 1 Ice pack (please put in freezer the night before surgery)   1 Hibiclens skin cleaner     Pre-op instructions  If you have any questions regarding the instructions, please do not hesitate to call our office.  [place]: [address], [place], [place] [address] -- [phone]  [place]: [address] [place], [place] [address] -- [phone]  [place]: [address]  [place], [place] [address] -- [phone]  [place]: [address], [address], [place], [place] [address] -- [phone]  Website:  [email or web address] 

## 2017-11-25 NOTE — Telephone Encounter (Signed)
"  I'm calling to schedule my surgery with Dr. Cannon Kettle for April 11 at [facility]."  Have you signed consent forms?  "I signed them this morning.  I'm going to have my physical on March 22."  I'll get it scheduled.  "What time will it be?"  It will be around lunch time.  You will get a call from someone from the surgical center a day or two prior to the surgery date and they will give you the exact arrival time.  I will get your information from Melody in our [place] office and get your surgery scheduled.  "Okay, thank you."

## 2017-11-25 NOTE — Progress Notes (Signed)
Subjective: Isabella Rojas is a 67 y.o. female patient who presents to office for follow up evaluation of left foot pain. Patient states that medrol dose pack made her feel nauseous could only take 2 days of the medication and had to stop it.  Reports pain still at this bunionette on the side of her left foot states that the pain at times is very severe 9 out of 10 with swelling and sharp pain especially with pressure or direct touch to the bump on the side of her foot.  Patient desires to discuss surgical options states that she wants it cut off and taking care of because she is tired of the pain at the side of her left foot.  Patient Active Problem List   Diagnosis Date Noted  . Spondylolisthesis of lumbar region 05/27/2016    Current Outpatient Medications on File Prior to Visit  Medication Sig Dispense Refill  . aspirin EC 81 MG tablet Take 81 mg by mouth daily.    . cyclobenzaprine (FLEXERIL) 10 MG tablet Take 1 tablet (10 mg total) by mouth 3 (three) times daily as needed for muscle spasms. 50 tablet 1  . DEXILANT 30 MG capsule Take 30 mg by mouth daily.  3  . docusate sodium (COLACE) 100 MG capsule Take 1 capsule (100 mg total) by mouth 2 (two) times daily. (Patient not taking: Reported on 04/14/2017) 60 capsule 0  . methylPREDNISolone (MEDROL DOSEPAK) 4 MG TBPK tablet Take as directed 21 tablet 0  . oxyCODONE-acetaminophen (PERCOCET/ROXICET) 5-325 MG tablet Take 1-2 tablets by mouth every 4 (four) hours as needed for severe pain. 50 tablet 0  . rosuvastatin (CRESTOR) 20 MG tablet Take 20 mg by mouth at bedtime.  2   No current facility-administered medications on file prior to visit.     Allergies  Allergen Reactions  . Zoloft [Sertraline Hcl] Palpitations    "I started sweating and I felt like I was going to have a heart attack"   Past Surgical History:  Procedure Laterality Date  . ABDOMINAL HYSTERECTOMY    . APPENDECTOMY    . COLONOSCOPY WITH ESOPHAGOGASTRODUODENOSCOPY  (EGD)    . KNEE ARTHROSCOPY Right    X 2  . WRIST SURGERY Right    No family history on file.   Social History   Socioeconomic History  . Marital status: Divorced    Spouse name: Not on file  . Number of children: Not on file  . Years of education: Not on file  . Highest education level: Not on file  Social Needs  . Financial resource strain: Not on file  . Food insecurity - worry: Not on file  . Food insecurity - inability: Not on file  . Transportation needs - medical: Not on file  . Transportation needs - non-medical: Not on file  Occupational History  . Not on file  Tobacco Use  . Smoking status: Former Research scientist (life sciences)  . Smokeless tobacco: Never Used  Substance and Sexual Activity  . Alcohol use: Yes    Alcohol/week: 1.2 oz    Types: 2 Glasses of wine per week    Comment: occasional  . Drug use: No  . Sexual activity: Not on file  Other Topics Concern  . Not on file  Social History Narrative  . Not on file    Objective:  General: Alert and oriented x3 in no acute distress  Dermatology: Nail procedure sites well healed bilateral with normal nail growth present. No open lesions bilateral lower extremities,  no webspace macerations, no ecchymosis bilateral, all nails x 10 are well manicured.  Vascular: Dorsalis Pedis and Posterior Tibial pedal pulses palpable, Capillary Fill Time 3 seconds,(+) pedal hair growth bilateral, no edema bilateral lower extremities, Temperature gradient within normal limits.  Neurology: Johney Maine sensation intact via light touch bilateral. (- )Tinels sign bilateral.   Musculoskeletal: Mild tenderness with palpation at left foot at tailor bunion.  No tenderness today along arch on left. No pain with calf compression bilateral. Strength within normal limits in all groups bilateral.    Assessment and Plan: Problem List Items Addressed This Visit    None    Visit Diagnoses    Tailor's bunion of left foot    -  Primary   Left foot pain       Plantar  fasciitis       Pre-op exam          -Complete examination performed -Discussed continued care for tailors bunion -Patient opt for surgical management. Consent obtained for left tailor's bunionectomy with internal screw fixation pre and Post op course explained. Risks, benefits, alternatives explained. No guarantees given or implied. Surgical booking slip submitted and provided patient with Surgical packet and info for [facility].  H&P form given for Farley Gilford Rile to use post op advised patient for the first 2-4 weeks will have to used a cane crutch or walker to help keep pressure off her forefoot -Continue with silicone tailors bunion shield meanwhile until time for surgery  -Recommend continue with good supportive shoes meanwhile until time for surgery  -Recommend continue daily stretching and icing as instructed to prevent recurrence of plantar fasciitis until time for surgery  -Patient to return to office after surgery or sooner if condition worsens.  Landis Martins, DPM

## 2017-12-15 DIAGNOSIS — K219 Gastro-esophageal reflux disease without esophagitis: Secondary | ICD-10-CM | POA: Diagnosis not present

## 2017-12-15 DIAGNOSIS — R233 Spontaneous ecchymoses: Secondary | ICD-10-CM | POA: Diagnosis not present

## 2017-12-15 DIAGNOSIS — Z01810 Encounter for preprocedural cardiovascular examination: Secondary | ICD-10-CM | POA: Diagnosis not present

## 2017-12-15 DIAGNOSIS — I441 Atrioventricular block, second degree: Secondary | ICD-10-CM | POA: Diagnosis not present

## 2017-12-15 DIAGNOSIS — E782 Mixed hyperlipidemia: Secondary | ICD-10-CM | POA: Diagnosis not present

## 2017-12-16 DIAGNOSIS — Z01810 Encounter for preprocedural cardiovascular examination: Secondary | ICD-10-CM | POA: Diagnosis not present

## 2017-12-16 DIAGNOSIS — Z01818 Encounter for other preprocedural examination: Secondary | ICD-10-CM | POA: Diagnosis not present

## 2017-12-28 ENCOUNTER — Other Ambulatory Visit: Payer: Self-pay | Admitting: Sports Medicine

## 2017-12-28 DIAGNOSIS — Z9889 Other specified postprocedural states: Secondary | ICD-10-CM

## 2017-12-28 NOTE — Progress Notes (Signed)
Ordered walker for patient to use after surgery -Dr. Cannon Kettle

## 2017-12-29 DIAGNOSIS — Z7982 Long term (current) use of aspirin: Secondary | ICD-10-CM | POA: Diagnosis not present

## 2017-12-29 DIAGNOSIS — Z87891 Personal history of nicotine dependence: Secondary | ICD-10-CM | POA: Diagnosis not present

## 2017-12-29 DIAGNOSIS — M21622 Bunionette of left foot: Secondary | ICD-10-CM | POA: Diagnosis not present

## 2017-12-29 DIAGNOSIS — I441 Atrioventricular block, second degree: Secondary | ICD-10-CM | POA: Diagnosis not present

## 2017-12-29 DIAGNOSIS — M2012 Hallux valgus (acquired), left foot: Secondary | ICD-10-CM | POA: Diagnosis not present

## 2017-12-29 DIAGNOSIS — K219 Gastro-esophageal reflux disease without esophagitis: Secondary | ICD-10-CM | POA: Diagnosis not present

## 2017-12-29 DIAGNOSIS — Z79899 Other long term (current) drug therapy: Secondary | ICD-10-CM | POA: Diagnosis not present

## 2017-12-29 DIAGNOSIS — E782 Mixed hyperlipidemia: Secondary | ICD-10-CM | POA: Diagnosis not present

## 2017-12-29 DIAGNOSIS — I700 Atherosclerosis of aorta: Secondary | ICD-10-CM | POA: Diagnosis not present

## 2018-01-02 ENCOUNTER — Telehealth: Payer: Self-pay | Admitting: *Deleted

## 2018-01-02 NOTE — Telephone Encounter (Signed)
Patient states that the oxycodone is making her sick requests a change in medication.  Having a lot of pain and pressure when trying to walk I boot recommended she loosen boot and ace bandage to see if that will help.  Can not pick up new Rx for pain med until visit as she does not have any transportation until then

## 2018-01-02 NOTE — Telephone Encounter (Signed)
She told me that she had Oxycodone before with no issues so that's why I wrote her an Rx for it. She should try to rest instead of walking, ice, elevate, and loosen the bandages. She can try taking 1/2 the pain pill with food. If it's still making her feel sick then discontinue and use OTC motrin, tyelnol, or aleve  -Dr. Cannon Kettle

## 2018-01-03 DIAGNOSIS — R9431 Abnormal electrocardiogram [ECG] [EKG]: Secondary | ICD-10-CM | POA: Diagnosis not present

## 2018-01-03 DIAGNOSIS — R002 Palpitations: Secondary | ICD-10-CM | POA: Diagnosis not present

## 2018-01-04 ENCOUNTER — Encounter: Payer: Self-pay | Admitting: Sports Medicine

## 2018-01-04 ENCOUNTER — Other Ambulatory Visit: Payer: Self-pay | Admitting: Sports Medicine

## 2018-01-04 ENCOUNTER — Ambulatory Visit (INDEPENDENT_AMBULATORY_CARE_PROVIDER_SITE_OTHER): Payer: Medicare Other | Admitting: Sports Medicine

## 2018-01-04 ENCOUNTER — Ambulatory Visit (INDEPENDENT_AMBULATORY_CARE_PROVIDER_SITE_OTHER): Payer: Medicare Other

## 2018-01-04 DIAGNOSIS — M79672 Pain in left foot: Secondary | ICD-10-CM

## 2018-01-04 DIAGNOSIS — Z9889 Other specified postprocedural states: Secondary | ICD-10-CM

## 2018-01-04 DIAGNOSIS — M21622 Bunionette of left foot: Secondary | ICD-10-CM

## 2018-01-04 MED ORDER — HYDROCODONE-ACETAMINOPHEN 10-325 MG PO TABS: 1.0000 | ORAL_TABLET | Freq: Four times a day (QID) | ORAL | 0 refills | Status: DC | PRN

## 2018-01-04 NOTE — Progress Notes (Signed)
Tailor's Bunionectomy with internal screw fixation left.

## 2018-01-04 NOTE — Progress Notes (Signed)
Subjective: Isabella Rojas is a 67 y.o. female patient seen today in office for POV #1 (DOS 12-29-17), S/P left tailor's bunionectomy. Patient admits pain at surgical site that is worse with the cam boot throbbing in nature 8 out of 10, reports that the Percocet she could not tolerate and wants me to change her medication to Norco/hydrocodone, denies calf pain, denies headache, chest pain, shortness of breath, admits that she had one episode of nausea that was relieved by her antinausea medicine fever, or chills. No other issues noted.   Patient Active Problem List   Diagnosis Date Noted  . Spondylolisthesis of lumbar region 05/27/2016    Current Outpatient Medications on File Prior to Visit  Medication Sig Dispense Refill  . aspirin EC 81 MG tablet Take 81 mg by mouth daily.    . cyclobenzaprine (FLEXERIL) 10 MG tablet Take 1 tablet (10 mg total) by mouth 3 (three) times daily as needed for muscle spasms. 50 tablet 1  . DEXILANT 30 MG capsule Take 30 mg by mouth daily.  3  . docusate sodium (COLACE) 100 MG capsule Take 1 capsule (100 mg total) by mouth 2 (two) times daily. 60 capsule 0  . methylPREDNISolone (MEDROL DOSEPAK) 4 MG TBPK tablet Take as directed 21 tablet 0  . oxyCODONE-acetaminophen (PERCOCET/ROXICET) 5-325 MG tablet Take 1-2 tablets by mouth every 4 (four) hours as needed for severe pain. 50 tablet 0  . rosuvastatin (CRESTOR) 20 MG tablet Take 20 mg by mouth at bedtime.  2   No current facility-administered medications on file prior to visit.     Allergies  Allergen Reactions  . Zoloft [Sertraline Hcl] Palpitations    "I started sweating and I felt like I was going to have a heart attack"    Objective: There were no vitals filed for this visit.  General: No acute distress, AAOx3  Left foot: Sutures intact with no gapping or dehiscence at surgical site, moderate swelling and ecchymosis to left foot, no erythema, no warmth, no drainage, no signs of infection noted,  Capillary fill time <3 seconds in all digits, gross sensation present via light touch to left foot.  Guarding with range of motion left foot.  No pain with calf compression.   Post Op Xray, Left foot: Fifth metatarsal in excellent alignment and position. Osteotomy site healing. Hardware intact. Soft tissue swelling within normal limits for post op status.   Assessment and Plan:  Problem List Items Addressed This Visit    None    Visit Diagnoses    Tailor's bunion of left foot    -  Primary   Relevant Medications   HYDROcodone-acetaminophen (NORCO) 10-325 MG tablet   Other Relevant Orders   DG Foot Complete Left   Status post left foot surgery       Relevant Medications   HYDROcodone-acetaminophen (NORCO) 10-325 MG tablet   Other Relevant Orders   DG Foot Complete Left   Left foot pain       Relevant Medications   HYDROcodone-acetaminophen (NORCO) 10-325 MG tablet      -Patient seen and evaluated -X-rays reviewed -Applied dry sterile dressing to surgical site left foot secured with ACE wrap and stockinet  -Advised patient to make sure to keep dressings clean, dry, and intact to left surgical site, removing the ACE as needed  -Changed her postop cam boot to a postop shoe to prevent rubbing or irritation -Advised patient to limit activity to necessity and to stay off foot to assist with  swelling and edema control.  Advised patient to refrain from driving. -Advised patient to ice and elevate as necessary  -Changed pain medicine from Percocet to [place] to take as instructed -Faxed a note on patient behalf to clerk of courts stating that patient cannot appear to court due to foot surgery -Will plan for possible suture removal at next office visit. In the meantime, patient to call office if any issues or problems arise.   Landis Martins, DPM

## 2018-01-10 DIAGNOSIS — R002 Palpitations: Secondary | ICD-10-CM | POA: Diagnosis not present

## 2018-01-10 DIAGNOSIS — Z7982 Long term (current) use of aspirin: Secondary | ICD-10-CM | POA: Diagnosis not present

## 2018-01-10 DIAGNOSIS — R9431 Abnormal electrocardiogram [ECG] [EKG]: Secondary | ICD-10-CM | POA: Diagnosis not present

## 2018-01-10 DIAGNOSIS — I471 Supraventricular tachycardia: Secondary | ICD-10-CM | POA: Diagnosis not present

## 2018-01-11 ENCOUNTER — Ambulatory Visit (INDEPENDENT_AMBULATORY_CARE_PROVIDER_SITE_OTHER): Payer: Medicare Other | Admitting: Sports Medicine

## 2018-01-11 ENCOUNTER — Encounter: Payer: Self-pay | Admitting: Sports Medicine

## 2018-01-11 DIAGNOSIS — Z9889 Other specified postprocedural states: Secondary | ICD-10-CM

## 2018-01-11 DIAGNOSIS — M79672 Pain in left foot: Secondary | ICD-10-CM

## 2018-01-11 DIAGNOSIS — M21622 Bunionette of left foot: Secondary | ICD-10-CM

## 2018-01-11 NOTE — Progress Notes (Signed)
Subjective: Isabella Rojas is a 67 y.o. female patient seen today in office for POV #2 (DOS 12-29-17), S/P left tailor's bunionectomy. Patient admits pain at surgical site that is better in postop shoe, reports that she is doing okay but some pain and swelling reports that her pain is controlled with pain medication, denies calf pain, denies headache, chest pain, shortness of breath, nausea, fever, or chills. No other issues noted.   Patient Active Problem List   Diagnosis Date Noted  . Spondylolisthesis of lumbar region 05/27/2016    Current Outpatient Medications on File Prior to Visit  Medication Sig Dispense Refill  . aspirin EC 81 MG tablet Take 81 mg by mouth daily.    . cyclobenzaprine (FLEXERIL) 10 MG tablet Take 1 tablet (10 mg total) by mouth 3 (three) times daily as needed for muscle spasms. 50 tablet 1  . DEXILANT 30 MG capsule Take 30 mg by mouth daily.  3  . docusate sodium (COLACE) 100 MG capsule Take 1 capsule (100 mg total) by mouth 2 (two) times daily. 60 capsule 0  . HYDROcodone-acetaminophen (NORCO) 10-325 MG tablet Take 1 tablet by mouth every 6 (six) hours as needed. 20 tablet 0  . methylPREDNISolone (MEDROL DOSEPAK) 4 MG TBPK tablet Take as directed 21 tablet 0  . oxyCODONE-acetaminophen (PERCOCET/ROXICET) 5-325 MG tablet Take 1-2 tablets by mouth every 4 (four) hours as needed for severe pain. 50 tablet 0  . rosuvastatin (CRESTOR) 20 MG tablet Take 20 mg by mouth at bedtime.  2   No current facility-administered medications on file prior to visit.     Allergies  Allergen Reactions  . Zoloft [Sertraline Hcl] Palpitations    "I started sweating and I felt like I was going to have a heart attack"    Objective: There were no vitals filed for this visit.  General: No acute distress, AAOx3  Left foot: Sutures intact with no gapping or dehiscence at surgical site, mild swelling and ecchymosis to left foot, no erythema, no warmth, no drainage, no signs of infection  noted, Capillary fill time <3 seconds in all digits, gross sensation present via light touch to left foot.  Guarding with range of motion left foot.  No pain with calf compression.   Assessment and Plan:  Problem List Items Addressed This Visit    None    Visit Diagnoses    Tailor's bunion of left foot    -  Primary   Status post left foot surgery       Left foot pain          -Patient seen and evaluated -Suture tails were removed -Applied dry sterile dressing to surgical site left foot secured with ACE wrap and stockinet  -Advised patient to make sure to keep dressings clean, dry, and intact to left surgical site, removing the ACE as needed  - continue with postop shoe -Advised patient to limit activity to necessity and to stay off foot to assist with swelling and edema control.  -Advised patient to ice and elevate as necessary  -Continue with Norco to take as instructed as needed for severe pain -Will plan for x-ray and possibly allowing patient to shower at next office visit. In the meantime, patient to call office if any issues or problems arise.   Landis Martins, DPM

## 2018-01-18 DIAGNOSIS — M810 Age-related osteoporosis without current pathological fracture: Secondary | ICD-10-CM | POA: Diagnosis not present

## 2018-01-18 DIAGNOSIS — Z122 Encounter for screening for malignant neoplasm of respiratory organs: Secondary | ICD-10-CM | POA: Diagnosis not present

## 2018-01-18 DIAGNOSIS — Z1231 Encounter for screening mammogram for malignant neoplasm of breast: Secondary | ICD-10-CM | POA: Diagnosis not present

## 2018-01-18 DIAGNOSIS — Z0001 Encounter for general adult medical examination with abnormal findings: Secondary | ICD-10-CM | POA: Diagnosis not present

## 2018-01-19 ENCOUNTER — Encounter: Payer: Self-pay | Admitting: Sports Medicine

## 2018-01-19 ENCOUNTER — Ambulatory Visit (INDEPENDENT_AMBULATORY_CARE_PROVIDER_SITE_OTHER): Payer: Medicare Other

## 2018-01-19 ENCOUNTER — Ambulatory Visit (INDEPENDENT_AMBULATORY_CARE_PROVIDER_SITE_OTHER): Payer: Medicare Other | Admitting: Sports Medicine

## 2018-01-19 ENCOUNTER — Other Ambulatory Visit: Payer: Self-pay | Admitting: Sports Medicine

## 2018-01-19 DIAGNOSIS — M21622 Bunionette of left foot: Secondary | ICD-10-CM

## 2018-01-19 DIAGNOSIS — Z9889 Other specified postprocedural states: Secondary | ICD-10-CM

## 2018-01-19 DIAGNOSIS — M79672 Pain in left foot: Secondary | ICD-10-CM

## 2018-01-19 NOTE — Progress Notes (Addendum)
Subjective: Isabella Rojas is a 67 y.o. female patient seen today in office for POV #3 (DOS 12-29-17), S/P left tailor's bunionectomy. Patient admits pain at surgical site is doing okay still some swelling and pain sometimes is worse 8 out of 10 when she has been up on her foot doing a lot, denies calf pain, denies headache, chest pain, shortness of breath, nausea, fever, or chills. No other issues noted.   Patient Active Problem List   Diagnosis Date Noted  . Spondylolisthesis of lumbar region 05/27/2016    Current Outpatient Medications on File Prior to Visit  Medication Sig Dispense Refill  . aspirin EC 81 MG tablet Take 81 mg by mouth daily.    . cyclobenzaprine (FLEXERIL) 10 MG tablet Take 1 tablet (10 mg total) by mouth 3 (three) times daily as needed for muscle spasms. 50 tablet 1  . DEXILANT 30 MG capsule Take 30 mg by mouth daily.  3  . docusate sodium (COLACE) 100 MG capsule Take 1 capsule (100 mg total) by mouth 2 (two) times daily. 60 capsule 0  . HYDROcodone-acetaminophen (NORCO) 10-325 MG tablet Take 1 tablet by mouth every 6 (six) hours as needed. 20 tablet 0  . methylPREDNISolone (MEDROL DOSEPAK) 4 MG TBPK tablet Take as directed 21 tablet 0  . oxyCODONE-acetaminophen (PERCOCET/ROXICET) 5-325 MG tablet Take 1-2 tablets by mouth every 4 (four) hours as needed for severe pain. 50 tablet 0  . rosuvastatin (CRESTOR) 20 MG tablet Take 20 mg by mouth at bedtime.  2   No current facility-administered medications on file prior to visit.     Allergies  Allergen Reactions  . Zoloft [Sertraline Hcl] Palpitations    "I started sweating and I felt like I was going to have a heart attack"    Objective: There were no vitals filed for this visit.  General: No acute distress, AAOx3  Left foot: Incision well-healed with no gapping or dehiscence at surgical site, mild swelling to left foot, no erythema, no warmth, no drainage, no signs of infection noted, Capillary fill time <3 seconds  in all digits, gross sensation present via light touch to left foot.  Decreased guarding with range of motion left foot.  No pain with calf compression.   X-ray left foot: K wire surgical site intact with distal osteotomy in position of the fifth metatarsal head and good alignment, there is mild soft tissue swelling no other acute findings.  Assessment and Plan:  Problem List Items Addressed This Visit    None    Visit Diagnoses    Status post left foot surgery    -  Primary   Relevant Orders   DG Foot Complete Left   Tailor's bunion of left foot       Left foot pain          -Patient seen and evaluated -X-rays reviewed -Applied Ace wrap and advised patient to continue with rest ice elevation to assist with edema and pain control -Patient may shower starting tomorrow as instructed advised patient if she sees drainage to call office and dressed with sterile gauze packet that was given otherwise to use antibiotic cream along the incision to prevent against infection -Advised patient to limit activity to necessity and to stay off foot to assist with swelling and edema control.  -Advised patient may slowly try a tennis shoe as swelling allows however may still continue with postoperative shoe when a normal shoe is not comfortable -Continue with Norco to take as instructed  as needed for severe pain -Will plan for transitioning completely out of postop shoe at next visit. In the meantime, patient to call office if any issues or problems arise.   Landis Martins, DPM

## 2018-01-23 ENCOUNTER — Telehealth: Payer: Self-pay | Admitting: *Deleted

## 2018-01-23 ENCOUNTER — Telehealth: Payer: Self-pay | Admitting: Sports Medicine

## 2018-01-23 NOTE — Telephone Encounter (Signed)
Entered in error

## 2018-01-23 NOTE — Telephone Encounter (Signed)
I informed pt, I would have doctor at the [facility] office refill the Tolu, and she could pick it up today. Pt states she hit her foot and it hurts, but she will pick it up tomorrow. I offered pt an appt and she states she has one.

## 2018-01-23 NOTE — Telephone Encounter (Signed)
I'm calling to see if I can get a last refill prescription of the pain pills. If you would please call me back at [phone]. Thank you.

## 2018-01-24 NOTE — Telephone Encounter (Signed)
Patient stated she will try to come in Wednesday to pick up will wait to print until then

## 2018-01-25 ENCOUNTER — Other Ambulatory Visit: Payer: Self-pay | Admitting: Sports Medicine

## 2018-01-25 ENCOUNTER — Ambulatory Visit (INDEPENDENT_AMBULATORY_CARE_PROVIDER_SITE_OTHER): Payer: Medicare Other | Admitting: Sports Medicine

## 2018-01-25 ENCOUNTER — Encounter: Payer: Self-pay | Admitting: Sports Medicine

## 2018-01-25 ENCOUNTER — Ambulatory Visit (INDEPENDENT_AMBULATORY_CARE_PROVIDER_SITE_OTHER): Payer: Medicare Other

## 2018-01-25 DIAGNOSIS — S99922A Unspecified injury of left foot, initial encounter: Secondary | ICD-10-CM | POA: Diagnosis not present

## 2018-01-25 DIAGNOSIS — M79672 Pain in left foot: Secondary | ICD-10-CM

## 2018-01-25 DIAGNOSIS — M21622 Bunionette of left foot: Secondary | ICD-10-CM

## 2018-01-25 DIAGNOSIS — Z9889 Other specified postprocedural states: Secondary | ICD-10-CM

## 2018-01-25 MED ORDER — HYDROCODONE-ACETAMINOPHEN 10-325 MG PO TABS: 1.0000 | ORAL_TABLET | Freq: Four times a day (QID) | ORAL | 0 refills | Status: DC | PRN

## 2018-01-25 NOTE — Progress Notes (Signed)
Subjective: Isabella Rojas is a 67 y.o. female patient seen today in office for POV #4 (DOS 12-29-17), S/P left tailor's bunionectomy. Patient admits pain at surgical site that has worsened 9 out of 10 after slamming her foot in the screen door at her home on Monday.  Patient states that she has taken all of her pain medication and needs a refill.  Reports that she tried to wear a normal shoe due to the pain and swelling and especially since his most recent injury cannot wear a normal shoe because it hurts too much.  Patient denies calf pain, denies headache, chest pain, shortness of breath, nausea, fever, or chills. No other issues noted.   Patient Active Problem List   Diagnosis Date Noted  . Spondylolisthesis of lumbar region 05/27/2016    Current Outpatient Medications on File Prior to Visit  Medication Sig Dispense Refill  . aspirin EC 81 MG tablet Take 81 mg by mouth daily.    . cyclobenzaprine (FLEXERIL) 10 MG tablet Take 1 tablet (10 mg total) by mouth 3 (three) times daily as needed for muscle spasms. 50 tablet 1  . DEXILANT 30 MG capsule Take 30 mg by mouth daily.  3  . docusate sodium (COLACE) 100 MG capsule Take 1 capsule (100 mg total) by mouth 2 (two) times daily. 60 capsule 0  . methylPREDNISolone (MEDROL DOSEPAK) 4 MG TBPK tablet Take as directed 21 tablet 0  . oxyCODONE-acetaminophen (PERCOCET/ROXICET) 5-325 MG tablet Take 1-2 tablets by mouth every 4 (four) hours as needed for severe pain. 50 tablet 0  . rosuvastatin (CRESTOR) 20 MG tablet Take 20 mg by mouth at bedtime.  2   No current facility-administered medications on file prior to visit.     Allergies  Allergen Reactions  . Zoloft [Sertraline Hcl] Palpitations    "I started sweating and I felt like I was going to have a heart attack"    Objective: There were no vitals filed for this visit.  General: No acute distress, AAOx3  Left foot: Incision well-healed with no gapping or dehiscence at surgical site, mild  swelling to left foot, no erythema, no warmth, no drainage, no signs of infection noted, Capillary fill time <3 seconds in all digits, gross sensation present via light touch to left foot.  Mild guarding with range of motion left foot with subjective pain at the surgical site that radiates to the dorsal lateral midfoot on left that has become an issue since traumatic injury of shutting foot in the door.  No pain with calf compression.   X-ray left foot: K wire surgical site intact with distal osteotomy in position of the fifth metatarsal head and good alignment, there is mild soft tissue swelling no other acute findings from new traumatic episode that patient experienced.  Assessment and Plan:  Problem List Items Addressed This Visit    None    Visit Diagnoses    Status post left foot surgery    -  Primary   Relevant Medications   HYDROcodone-acetaminophen (NORCO) 10-325 MG tablet   Other Relevant Orders   DG Foot Complete Left   Tailor's bunion of left foot       Relevant Medications   HYDROcodone-acetaminophen (NORCO) 10-325 MG tablet   Left foot pain       Relevant Medications   HYDROcodone-acetaminophen (NORCO) 10-325 MG tablet   Injury of left foot, initial encounter          -Patient seen and evaluated -X-rays reviewed -Applied  a soft cast/Unna boot and advised patient to keep intact for 5 to 7 days  -Patient to continue with rest ice elevation to assist with edema and pain control -Refilled Norco to take as instructed for severe pain and recommend Aleve for mild pain -Advised patient to continue with postoperative shoe until next visit -Will plan for postop check and transitioning completely out of postop shoe at next visit if pain and swelling is better. In the meantime, patient to call office if any issues or problems arise.   Landis Martins, DPM

## 2018-01-31 DIAGNOSIS — L820 Inflamed seborrheic keratosis: Secondary | ICD-10-CM | POA: Diagnosis not present

## 2018-01-31 DIAGNOSIS — L814 Other melanin hyperpigmentation: Secondary | ICD-10-CM | POA: Diagnosis not present

## 2018-01-31 DIAGNOSIS — D2239 Melanocytic nevi of other parts of face: Secondary | ICD-10-CM | POA: Diagnosis not present

## 2018-02-02 ENCOUNTER — Encounter: Payer: Medicare Other | Admitting: Sports Medicine

## 2018-02-08 ENCOUNTER — Encounter: Payer: Self-pay | Admitting: Sports Medicine

## 2018-02-08 ENCOUNTER — Ambulatory Visit (INDEPENDENT_AMBULATORY_CARE_PROVIDER_SITE_OTHER): Payer: Medicare Other | Admitting: Sports Medicine

## 2018-02-08 VITALS — BP 116/72 | HR 67 | Temp 98.1°F

## 2018-02-08 DIAGNOSIS — Z9889 Other specified postprocedural states: Secondary | ICD-10-CM

## 2018-02-08 DIAGNOSIS — M79672 Pain in left foot: Secondary | ICD-10-CM

## 2018-02-08 DIAGNOSIS — M21622 Bunionette of left foot: Secondary | ICD-10-CM

## 2018-02-08 DIAGNOSIS — S99922D Unspecified injury of left foot, subsequent encounter: Secondary | ICD-10-CM

## 2018-02-08 MED ORDER — HYDROCODONE-ACETAMINOPHEN 10-325 MG PO TABS: 1.0000 | ORAL_TABLET | Freq: Four times a day (QID) | ORAL | 0 refills | Status: DC | PRN

## 2018-02-08 NOTE — Progress Notes (Signed)
Subjective: Isabella Rojas is a 67 y.o. female patient seen today in office for POV #5 (DOS 12-29-17), S/P left tailor's bunionectomy. Patient admits pain at surgical site and swelling 8/10 sharp pain and is not sure if the unna boot helped "pressed on my foot a lot".  Patient denies calf pain, denies headache, chest pain, shortness of breath, nausea, fever, or chills. No other issues noted.   Patient Active Problem List   Diagnosis Date Noted  . Spondylolisthesis of lumbar region 05/27/2016    Current Outpatient Medications on File Prior to Visit  Medication Sig Dispense Refill  . aspirin EC 81 MG tablet Take 81 mg by mouth daily.    . cyclobenzaprine (FLEXERIL) 10 MG tablet Take 1 tablet (10 mg total) by mouth 3 (three) times daily as needed for muscle spasms. 50 tablet 1  . DEXILANT 30 MG capsule Take 30 mg by mouth daily.  3  . docusate sodium (COLACE) 100 MG capsule Take 1 capsule (100 mg total) by mouth 2 (two) times daily. 60 capsule 0  . HYDROcodone-acetaminophen (NORCO) 10-325 MG tablet Take 1 tablet by mouth every 6 (six) hours as needed. 20 tablet 0  . methylPREDNISolone (MEDROL DOSEPAK) 4 MG TBPK tablet Take as directed 21 tablet 0  . oxyCODONE-acetaminophen (PERCOCET/ROXICET) 5-325 MG tablet Take 1-2 tablets by mouth every 4 (four) hours as needed for severe pain. 50 tablet 0  . rosuvastatin (CRESTOR) 20 MG tablet Take 20 mg by mouth at bedtime.  2   No current facility-administered medications on file prior to visit.     Allergies  Allergen Reactions  . Zoloft [Sertraline Hcl] Palpitations    "I started sweating and I felt like I was going to have a heart attack"    Objective: There were no vitals filed for this visit.  General: No acute distress, AAOx3  Left foot:After nurse removed unna boot, Incision well-healed with no gapping or dehiscence at surgical site, mild swelling to left foot, no erythema, no warmth, no drainage, no signs of infection noted, Capillary fill  time <3 seconds in all digits, gross sensation present via light touch to left foot.  Mild guarding with range of motion left foot with subjective pain at the surgical site that radiates to the dorsal lateral midfoot on left and itching sensation.  No pain with calf compression.   Assessment and Plan:  Problem List Items Addressed This Visit    None    Visit Diagnoses    Injury of left foot, subsequent encounter    -  Primary   Status post left foot surgery       Tailor's bunion of left foot       Left foot pain          -Patient seen and evaluated -Recommend continue with ace wrap for edema control -Advised patient to use Cortizone cream on foot and leg for itchy sensation in the absence of rash or skin irritation  -Patient to continue with rest ice elevation to assist with edema and pain control -Refilled Norco to take as instructed for severe pain and recommend Aleve for mild pain -Advised patient to continue with postoperative shoe until next week and then may slowly transition to tennis shoe -Will plan for postop check and Xrays at next visit. In the meantime, patient to call office if any issues or problems arise.   Landis Martins, DPM

## 2018-03-07 ENCOUNTER — Other Ambulatory Visit: Payer: Self-pay | Admitting: Gastroenterology

## 2018-03-08 DIAGNOSIS — I700 Atherosclerosis of aorta: Secondary | ICD-10-CM | POA: Diagnosis not present

## 2018-03-08 DIAGNOSIS — Z87891 Personal history of nicotine dependence: Secondary | ICD-10-CM | POA: Diagnosis not present

## 2018-03-08 DIAGNOSIS — Z1231 Encounter for screening mammogram for malignant neoplasm of breast: Secondary | ICD-10-CM | POA: Diagnosis not present

## 2018-03-08 DIAGNOSIS — R918 Other nonspecific abnormal finding of lung field: Secondary | ICD-10-CM | POA: Diagnosis not present

## 2018-03-08 DIAGNOSIS — M810 Age-related osteoporosis without current pathological fracture: Secondary | ICD-10-CM | POA: Diagnosis not present

## 2018-03-08 DIAGNOSIS — R911 Solitary pulmonary nodule: Secondary | ICD-10-CM | POA: Diagnosis not present

## 2018-03-09 ENCOUNTER — Ambulatory Visit (INDEPENDENT_AMBULATORY_CARE_PROVIDER_SITE_OTHER): Payer: Medicare Other

## 2018-03-09 ENCOUNTER — Encounter: Payer: Self-pay | Admitting: Sports Medicine

## 2018-03-09 ENCOUNTER — Ambulatory Visit (INDEPENDENT_AMBULATORY_CARE_PROVIDER_SITE_OTHER): Payer: Medicare Other | Admitting: Sports Medicine

## 2018-03-09 ENCOUNTER — Other Ambulatory Visit: Payer: Self-pay | Admitting: Sports Medicine

## 2018-03-09 VITALS — BP 135/71 | HR 62 | Temp 98.4°F | Resp 16

## 2018-03-09 DIAGNOSIS — S99922D Unspecified injury of left foot, subsequent encounter: Secondary | ICD-10-CM

## 2018-03-09 DIAGNOSIS — Z9889 Other specified postprocedural states: Secondary | ICD-10-CM

## 2018-03-09 DIAGNOSIS — M79672 Pain in left foot: Secondary | ICD-10-CM

## 2018-03-09 DIAGNOSIS — M21622 Bunionette of left foot: Secondary | ICD-10-CM

## 2018-03-09 MED ORDER — GABAPENTIN 300 MG PO CAPS: 300.0000 mg | ORAL_CAPSULE | Freq: Every day | ORAL | 3 refills | Status: DC

## 2018-03-09 MED ORDER — HYDROCODONE-ACETAMINOPHEN 10-325 MG PO TABS: 1.0000 | ORAL_TABLET | Freq: Four times a day (QID) | ORAL | 0 refills | Status: AC | PRN

## 2018-03-09 NOTE — Progress Notes (Signed)
Subjective: Isabella Rojas is a 67 y.o. female patient seen today in office for POV #6 (DOS 12-29-17), S/P left tailor's bunionectomy. Patient admits pain at surgical site, had an episode on Sunday that is better now. Patient admits swelling as well and states her pain was 10/10 sharp pain but "quite better now".  Patient denies calf pain, denies headache, chest pain, shortness of breath, nausea, fever, or chills. No other issues noted.   Patient Active Problem List   Diagnosis Date Noted  . Spondylolisthesis of lumbar region 05/27/2016    Current Outpatient Medications on File Prior to Visit  Medication Sig Dispense Refill  . aspirin EC 81 MG tablet Take 81 mg by mouth daily.    . cyclobenzaprine (FLEXERIL) 10 MG tablet Take 1 tablet (10 mg total) by mouth 3 (three) times daily as needed for muscle spasms. 50 tablet 1  . DEXILANT 30 MG capsule TAKE ONE CAPSULE BY MOUTH EVERY DAY 30 capsule 1  . docusate sodium (COLACE) 100 MG capsule Take 1 capsule (100 mg total) by mouth 2 (two) times daily. 60 capsule 0  . methylPREDNISolone (MEDROL DOSEPAK) 4 MG TBPK tablet Take as directed 21 tablet 0  . oxyCODONE-acetaminophen (PERCOCET/ROXICET) 5-325 MG tablet Take 1-2 tablets by mouth every 4 (four) hours as needed for severe pain. 50 tablet 0  . rosuvastatin (CRESTOR) 20 MG tablet Take 20 mg by mouth at bedtime.  2   No current facility-administered medications on file prior to visit.     Allergies  Allergen Reactions  . Zoloft [Sertraline Hcl] Palpitations    "I started sweating and I felt like I was going to have a heart attack"    Objective: There were no vitals filed for this visit.  General: No acute distress, AAOx3  Left foot:After nurse removed unna boot, Incision well-healed with no gapping or dehiscence at surgical site, mild swelling to left foot, no erythema, no warmth, no drainage, no signs of infection noted, Capillary fill time <3 seconds in all digits, gross sensation present  via light touch to left foot.  Mild guarding with range of motion left foot with subjective pain at the surgical site that radiates to the dorsal lateral midfoot on left and itching sensation.  No pain with calf compression.   Xray- Hardware intact 5th metatarsal in excellent alignement and osteotomy site healed.   Assessment and Plan:  Problem List Items Addressed This Visit    None    Visit Diagnoses    Injury of left foot, subsequent encounter    -  Primary   Relevant Orders   DG Foot Complete Left   Status post left foot surgery       Relevant Medications   HYDROcodone-acetaminophen (NORCO) 10-325 MG tablet   Other Relevant Orders   DG Foot Complete Left   Tailor's bunion of left foot       Relevant Medications   HYDROcodone-acetaminophen (NORCO) 10-325 MG tablet   Other Relevant Orders   DG Foot Complete Left   Left foot pain       Relevant Medications   HYDROcodone-acetaminophen (NORCO) 10-325 MG tablet      -Patient seen and evaluated -x-rays reviewed  -Recommend continue with ace wrap for edema control or look into getting compression stockings  -Refilled Norco to take as instructed for severe pain and recommend Aleve for mild pain also added Gabapentin at bedtime to help with sharp pain patient feels sometimes  -Advised patient  may slowly transition to tennis  shoe as tolerated  -Will plan for postop check med check and MRI if symptoms continue at next visit. In the meantime, patient to call office if any issues or problems arise.   Landis Martins, DPM

## 2018-03-13 DIAGNOSIS — Z87891 Personal history of nicotine dependence: Secondary | ICD-10-CM | POA: Diagnosis not present

## 2018-03-13 DIAGNOSIS — M810 Age-related osteoporosis without current pathological fracture: Secondary | ICD-10-CM | POA: Diagnosis not present

## 2018-03-21 DIAGNOSIS — M818 Other osteoporosis without current pathological fracture: Secondary | ICD-10-CM | POA: Diagnosis not present

## 2018-03-21 DIAGNOSIS — I441 Atrioventricular block, second degree: Secondary | ICD-10-CM | POA: Diagnosis not present

## 2018-03-21 DIAGNOSIS — J410 Simple chronic bronchitis: Secondary | ICD-10-CM | POA: Diagnosis not present

## 2018-03-21 DIAGNOSIS — K219 Gastro-esophageal reflux disease without esophagitis: Secondary | ICD-10-CM | POA: Diagnosis not present

## 2018-03-21 DIAGNOSIS — E782 Mixed hyperlipidemia: Secondary | ICD-10-CM | POA: Diagnosis not present

## 2018-04-07 DIAGNOSIS — J410 Simple chronic bronchitis: Secondary | ICD-10-CM | POA: Diagnosis not present

## 2018-04-12 ENCOUNTER — Ambulatory Visit (INDEPENDENT_AMBULATORY_CARE_PROVIDER_SITE_OTHER): Payer: Medicare Other | Admitting: Sports Medicine

## 2018-04-12 ENCOUNTER — Telehealth: Payer: Self-pay | Admitting: *Deleted

## 2018-04-12 ENCOUNTER — Encounter: Payer: Self-pay | Admitting: Sports Medicine

## 2018-04-12 VITALS — BP 140/75 | HR 68 | Temp 98.6°F | Resp 16

## 2018-04-12 DIAGNOSIS — M21622 Bunionette of left foot: Secondary | ICD-10-CM

## 2018-04-12 DIAGNOSIS — M79672 Pain in left foot: Secondary | ICD-10-CM | POA: Diagnosis not present

## 2018-04-12 DIAGNOSIS — Z9889 Other specified postprocedural states: Secondary | ICD-10-CM

## 2018-04-12 NOTE — Progress Notes (Signed)
Subjective: Isabella Rojas is a 67 y.o. female patient seen today in office for POV #7 (DOS 12-29-17), S/P left tailor's bunionectomy. Patient admits pain at surgical site, pain is 8/10 sharp and constant unable to wear normal shoe for long periods due to pain. Admits some swelling at ankle and the Gabapentin made her nauseous with the 1st pill. Patient denies calf pain, denies headache, chest pain, shortness of breath, nausea, fever, or chills. No other issues noted.   Patient Active Problem List   Diagnosis Date Noted  . Spondylolisthesis of lumbar region 05/27/2016    Current Outpatient Medications on File Prior to Visit  Medication Sig Dispense Refill  . aspirin EC 81 MG tablet Take 81 mg by mouth daily.    . cyclobenzaprine (FLEXERIL) 10 MG tablet Take 1 tablet (10 mg total) by mouth 3 (three) times daily as needed for muscle spasms. 50 tablet 1  . DEXILANT 30 MG capsule TAKE ONE CAPSULE BY MOUTH EVERY DAY 30 capsule 1  . docusate sodium (COLACE) 100 MG capsule Take 1 capsule (100 mg total) by mouth 2 (two) times daily. 60 capsule 0  . gabapentin (NEURONTIN) 300 MG capsule Take 1 capsule (300 mg total) by mouth at bedtime. 90 capsule 3  . HYDROcodone-acetaminophen (NORCO) 10-325 MG tablet Take 1 tablet by mouth every 6 (six) hours as needed. 20 tablet 0  . methylPREDNISolone (MEDROL DOSEPAK) 4 MG TBPK tablet Take as directed 21 tablet 0  . oxyCODONE-acetaminophen (PERCOCET/ROXICET) 5-325 MG tablet Take 1-2 tablets by mouth every 4 (four) hours as needed for severe pain. 50 tablet 0  . rosuvastatin (CRESTOR) 20 MG tablet Take 20 mg by mouth at bedtime.  2   No current facility-administered medications on file prior to visit.     Allergies  Allergen Reactions  . Zoloft [Sertraline Hcl] Palpitations    "I started sweating and I felt like I was going to have a heart attack"    Objective: There were no vitals filed for this visit.  General: No acute distress, AAOx3  Left  foot:Surgical site well healed, minimal swelling to left foot, no erythema, no warmth, no drainage, no signs of infection noted, Capillary fill time <3 seconds in all digits, gross sensation present via light touch to left foot.  Mild guarding with range of motion left foot with subjective pain at the surgical site that radiates around foot that is sharp, No pain with calf compression.   Assessment and Plan:  Problem List Items Addressed This Visit    None    Visit Diagnoses    Status post left foot surgery    -  Primary   Tailor's bunion of left foot       Left foot pain          -Patient seen and evaluated -Recommend continue with ace wrap for edema control or look into getting compression stockings to knee for edema control  -Continue with Cymbalta as given by PCP -May try OTC pain patch  -Referred to pain mgt  -Advised patient  may slowly transition to tennis shoe as tolerated  -Return PRN. In the meantime, patient to call office if any issues or problems arise.   Landis Martins, DPM

## 2018-04-12 NOTE — Telephone Encounter (Signed)
Faxed required referral form, and demographics to [facility].

## 2018-04-12 NOTE — Telephone Encounter (Signed)
-----   Message from [place], [place] sent at 04/12/2018  8:44 AM EDT ----- Regarding: Refer to [facility] Pain in left foot sharp constant 8/10 that's relieved by Norco Patient is out of post op global and advised patient can no longer give pain medications Gabapentin made her nauseous PCP started Cymbalta 1 week ago

## 2018-05-12 DIAGNOSIS — M4316 Spondylolisthesis, lumbar region: Secondary | ICD-10-CM | POA: Diagnosis not present

## 2018-05-12 DIAGNOSIS — M545 Low back pain: Secondary | ICD-10-CM | POA: Diagnosis not present

## 2018-05-12 DIAGNOSIS — R030 Elevated blood-pressure reading, without diagnosis of hypertension: Secondary | ICD-10-CM | POA: Diagnosis not present

## 2018-05-30 DIAGNOSIS — W57XXXA Bitten or stung by nonvenomous insect and other nonvenomous arthropods, initial encounter: Secondary | ICD-10-CM | POA: Diagnosis not present

## 2018-05-30 DIAGNOSIS — L298 Other pruritus: Secondary | ICD-10-CM | POA: Diagnosis not present

## 2018-06-02 DIAGNOSIS — M545 Low back pain: Secondary | ICD-10-CM | POA: Diagnosis not present

## 2018-06-14 DIAGNOSIS — M545 Low back pain: Secondary | ICD-10-CM | POA: Diagnosis not present

## 2018-06-23 DIAGNOSIS — N644 Mastodynia: Secondary | ICD-10-CM | POA: Diagnosis not present

## 2018-06-23 DIAGNOSIS — M818 Other osteoporosis without current pathological fracture: Secondary | ICD-10-CM | POA: Diagnosis not present

## 2018-06-23 DIAGNOSIS — I700 Atherosclerosis of aorta: Secondary | ICD-10-CM | POA: Diagnosis not present

## 2018-06-23 DIAGNOSIS — E782 Mixed hyperlipidemia: Secondary | ICD-10-CM | POA: Diagnosis not present

## 2018-06-23 DIAGNOSIS — J410 Simple chronic bronchitis: Secondary | ICD-10-CM | POA: Diagnosis not present

## 2018-06-27 DIAGNOSIS — M545 Low back pain: Secondary | ICD-10-CM | POA: Diagnosis not present

## 2018-06-29 DIAGNOSIS — M545 Low back pain: Secondary | ICD-10-CM | POA: Diagnosis not present

## 2018-07-13 DIAGNOSIS — I471 Supraventricular tachycardia: Secondary | ICD-10-CM | POA: Diagnosis not present

## 2018-07-13 DIAGNOSIS — Z87891 Personal history of nicotine dependence: Secondary | ICD-10-CM | POA: Diagnosis not present

## 2018-07-13 DIAGNOSIS — R002 Palpitations: Secondary | ICD-10-CM | POA: Diagnosis not present

## 2018-07-13 DIAGNOSIS — Z7982 Long term (current) use of aspirin: Secondary | ICD-10-CM | POA: Diagnosis not present

## 2018-07-20 ENCOUNTER — Other Ambulatory Visit: Payer: Self-pay | Admitting: Gastroenterology

## 2018-07-25 ENCOUNTER — Telehealth: Payer: Self-pay | Admitting: Gastroenterology

## 2018-07-25 NOTE — Telephone Encounter (Signed)
Would you like to refill medication, if so how many?

## 2018-07-25 NOTE — Telephone Encounter (Signed)
Patient states she needs a refill of medication dexilant sent to [facility] in [place]. Pt scheduled for ov as well.

## 2018-07-26 MED ORDER — DEXLANSOPRAZOLE 30 MG PO CPDR: 1.0000 | DELAYED_RELEASE_CAPSULE | Freq: Every day | ORAL | 11 refills | Status: DC

## 2018-07-26 NOTE — Telephone Encounter (Signed)
Sent refills to patients pharmacy.  

## 2018-07-26 NOTE — Telephone Encounter (Signed)
Same dose Lets do 11 refills Follow-up here-routine

## 2018-08-03 ENCOUNTER — Encounter: Payer: Self-pay | Admitting: Gastroenterology

## 2018-08-10 ENCOUNTER — Ambulatory Visit: Payer: Commercial Managed Care - HMO | Admitting: Gastroenterology

## 2018-08-29 ENCOUNTER — Ambulatory Visit: Payer: Medicare Other | Admitting: Gastroenterology

## 2018-09-21 ENCOUNTER — Encounter: Payer: Self-pay | Admitting: Gastroenterology

## 2018-10-03 ENCOUNTER — Encounter: Payer: Self-pay | Admitting: Gastroenterology

## 2018-10-03 ENCOUNTER — Ambulatory Visit (INDEPENDENT_AMBULATORY_CARE_PROVIDER_SITE_OTHER): Payer: Medicare Other | Admitting: Gastroenterology

## 2018-10-03 VITALS — BP 138/76 | HR 73 | Ht 60.0 in | Wt 123.1 lb

## 2018-10-03 DIAGNOSIS — R1013 Epigastric pain: Secondary | ICD-10-CM | POA: Diagnosis not present

## 2018-10-03 NOTE — Patient Instructions (Signed)
If you are age 69 or older, your body mass index should be between 23-30. Your Body mass index is 24.05 kg/m. If this is out of the aforementioned range listed, please consider follow up with your Primary Care Provider.  If you are age 36 or younger, your body mass index should be between 19-25. Your Body mass index is 24.05 kg/m. If this is out of the aformentioned range listed, please consider follow up with your Primary Care Provider.   You have been scheduled for an abdominal ultrasound at [facility] (1st floor) on 10/10/18 at 9am. Please arrive 15 minutes prior to your appointment for registration. Make certain not to have anything to eat or drink 6 hours prior to your appointment. Should you need to reschedule your appointment, please contact radiology at [phone]. This test typically takes about 30 minutes to perform.  It has been recommended that you have an EGD in April of 2020, please call back to schedule this.    When you have gastroesophageal reflux disease (GERD), the foods you eat and your eating habits are very important. Choosing the right foods can help ease your discomfort. Think about working with a nutrition specialist (dietitian) to help you make good choices. What are tips for following this plan?  Meals  Choose healthy foods that are low in fat, such as fruits, vegetables, whole grains, low-fat dairy products, and lean meat, fish, and poultry.  Eat small meals often instead of 3 large meals a day. Eat your meals slowly, and in a place where you are relaxed. Avoid bending over or lying down until 2-3 hours after eating.  Avoid eating meals 2-3 hours before bed.  Avoid drinking a lot of liquid with meals.  Cook foods using methods other than frying. Bake, grill, or broil food instead.  Avoid or limit: ? Chocolate. ? Peppermint or spearmint. ? Alcohol. ? Pepper. ? Black and decaffeinated coffee. ? Black and decaffeinated tea. ? Bubbly (carbonated)  soft drinks. ? Caffeinated energy drinks and soft drinks.  Limit high-fat foods such as: ? Fatty meat or fried foods. ? Whole milk, cream, butter, or ice cream. ? Nuts and nut butters. ? Pastries, donuts, and sweets made with butter or shortening.  Avoid foods that cause symptoms. These foods may be different for everyone. Common foods that cause symptoms include: ? Tomatoes. ? Oranges, lemons, and limes. ? Peppers. ? Spicy food. ? Onions and garlic. ? Vinegar. Lifestyle  Maintain a healthy weight. Ask your doctor what weight is healthy for you. If you need to lose weight, work with your doctor to do so safely.  Exercise for at least 30 minutes for 5 or more days each week, or as told by your doctor.  Wear loose-fitting clothes.  Do not smoke. If you need help quitting, ask your doctor.  Sleep with the head of your bed higher than your feet. Use a wedge under the mattress or blocks under the bed frame to raise the head of the bed. Summary  When you have gastroesophageal reflux disease (GERD), food and lifestyle choices are very important in easing your symptoms.  Eat small meals often instead of 3 large meals a day. Eat your meals slowly, and in a place where you are relaxed.  Limit high-fat foods such as fatty meat or fried foods.  Avoid bending over or lying down until 2-3 hours after eating.  Avoid peppermint and spearmint, caffeine, alcohol, and chocolate. This information is not intended to replace advice  given to you by your health care provider. Make sure you discuss any questions you have with your health care provider. Document Released: 03/07/2012 Document Revised: 10/12/2016 Document Reviewed: 10/12/2016 Elsevier Interactive Patient Education  2019 [employer].  Thank you,  Dr. Jackquline Denmark

## 2018-10-03 NOTE — Progress Notes (Signed)
Chief Complaint: FU  Referring Provider:  Rochel Brome, MD      ASSESSMENT AND PLAN;   #1.  Epigastric pain. D/d PUD, GERD, gastritis, nonulcer dyspepsia, gastroparesis, musculoskeletal etiology, r/o gallbladder or pancreatic problems. #2.  Abdominal bloating #3.  IBS with predominant constipation (likely exacerbated by pain medications). Neg colon 05/2015 except mild sigmoid diverticulosis.  Next due 05/2025. #4.  GERD with erosive esophagitis (EGD 12/2015).  Plan: - Please obtain recent blood work (performed last week). - [place] complete abdomen as soon as available. - If still with problems, proceed with HIDA scan with ejection fraction to rule out biliary dyskinesia. - EGD if still with problems and the ultrasound is negative. Discussed risks and benefits. - Diet for sensitive stomachs.  Avoid raw vegetables especially broccoli. - Continue Dexilant 30mg  po qd. #30, 11 refills. - Follow-up in 12 weeks.  Earlier, if still with problems. - Encouraged her to continue exercising which would definitely help. - Minimize all pain medications.  Increase water intake. - If still with constipation, she would start taking stool softeners every day.  If still with problems, will give her a trial of Linzess.    HPI:    Isabella Rojas is a 68 y.o. female  For follow-up Still with epigastric discomfort especially after eating. Had blood work performed recently -I do not find that in epic No significant nausea or vomiting. Abdominal bloating Abdominal pain is worse after she eats broccoli/raw vegetables No dysphagia or odynophagia. No significant heartburn as long as she takes Dexilant. She has stopped taking oxycodone and is currently using Norco only on a as needed basis Started walking/exercising with good relief Currently having bowel movement almost every day Occasional pellet-like stools No melena or hematochezia No recent weight loss No jaundice dark urine or pale  stools.   Past Medical History:  Diagnosis Date  . Arthritis   . Bronchitis   . Depression   . GERD (gastroesophageal reflux disease)   . Osteoporosis   . Pneumonia    hx of    Past Surgical History:  Procedure Laterality Date  . ABDOMINAL HYSTERECTOMY    . APPENDECTOMY    . BACK SURGERY  2017   rod and 4 screws  . COLONOSCOPY  06/18/2015   Mild sigmoid divericulosis. Otherwise nomral colonoscopy.   . ESOPHAGOGASTRODUODENOSCOPY  01/05/2016   Mild gastritis. Healed distal esophageal erosions. Otherwise normal EGD.  Marland Kitchen ESOPHAGOGASTRODUODENOSCOPY  01/05/2016   Mild  gastritis. Healed distal esophageal erosions. Otherwise normal EGD.   Marland Kitchen KNEE ARTHROSCOPY Right    X 2  . WRIST SURGERY Right    has a plate around 1856     Family History  Problem Relation Age of Onset  . Prostate cancer Maternal Uncle   . Colon cancer Neg Hx   . Esophageal cancer Neg Hx   . Breast cancer Neg Hx     Social History   Tobacco Use  . Smoking status: Former Research scientist (life sciences)  . Smokeless tobacco: Never Used  . Tobacco comment: quit around 2013  Substance Use Topics  . Alcohol use: Yes    Alcohol/week: 2.0 standard drinks    Types: 2 Glasses of wine per week    Comment: occasional  . Drug use: No    Current Outpatient Medications  Medication Sig Dispense Refill  . Aromatic Inhalants (VAPOR INHALER IN) Inhale into the lungs as needed. Has an inhaler but doesn't know the name    . aspirin EC 81 MG tablet  Take 81 mg by mouth daily.    . cyclobenzaprine (FLEXERIL) 10 MG tablet Take 1 tablet (10 mg total) by mouth 3 (three) times daily as needed for muscle spasms. 50 tablet 1  . Dexlansoprazole (DEXILANT) 30 MG capsule Take 1 capsule (30 mg total) by mouth daily. 30 capsule 11  . HYDROcodone-acetaminophen (NORCO) 10-325 MG tablet Take 1 tablet by mouth every 6 (six) hours as needed. 20 tablet 0   No current facility-administered medications for this visit.     Allergies  Allergen Reactions  .  Zoloft [Sertraline Hcl] Palpitations    "I started sweating and I felt like I was going to have a heart attack"    Review of Systems:  Negative except for HPI     Physical Exam:    BP 138/76   Pulse 73   Ht 5' (1.524 m)   Wt 123 lb 2 oz (55.8 kg)   BMI 24.05 kg/m  Filed Weights   10/03/18 1049  Weight: 123 lb 2 oz (55.8 kg)   Constitutional:  Well-developed, in no acute distress. Psychiatric: Normal mood and affect. Behavior is normal. HEENT: Pupils normal.  Conjunctivae are normal. No scleral icterus. Neck supple.  Cardiovascular: Normal rate, regular rhythm. No edema Pulmonary/chest: Effort normal and breath sounds normal. No wheezing, rales or rhonchi. Abdominal: Soft, nondistended.  Mild tenderness epigastric. Bowel sounds active throughout. There are no masses palpable. No hepatomegaly. Rectal:  defered Neurological: Alert and oriented to person place and time. Skin: Skin is warm and dry. No rashes noted.    Carmell Austria, MD 10/03/2018, 11:09 AM  Cc: Rochel Brome, MD

## 2018-10-10 ENCOUNTER — Ambulatory Visit (HOSPITAL_BASED_OUTPATIENT_CLINIC_OR_DEPARTMENT_OTHER)
Admission: RE | Admit: 2018-10-10 | Discharge: 2018-10-10 | Disposition: A | Discharge status: Home / Self Care | Payer: Medicare Other | Source: Ambulatory Visit | Attending: Gastroenterology | Admitting: Gastroenterology

## 2018-10-10 DIAGNOSIS — R1013 Epigastric pain: Secondary | ICD-10-CM | POA: Diagnosis not present

## 2018-10-12 ENCOUNTER — Telehealth: Payer: Self-pay | Admitting: Gastroenterology

## 2018-10-12 MED ORDER — DEXLANSOPRAZOLE 30 MG PO CPDR: 1.0000 | DELAYED_RELEASE_CAPSULE | Freq: Every day | ORAL | 11 refills | Status: AC

## 2018-10-12 NOTE — Telephone Encounter (Signed)
Pt would like a refill on med Dexlansoprazole ([place]) At [facility] on [address]Isabella Rojas

## 2018-10-12 NOTE — Telephone Encounter (Signed)
Ultrasound-negative for acute abnormalities Please inform patient about the results.

## 2018-10-12 NOTE — Telephone Encounter (Signed)
Sent refill to patients pharmacy. Patient notified.

## 2018-10-12 NOTE — Telephone Encounter (Signed)
Pt called for ct results

## 2018-10-12 NOTE — Telephone Encounter (Signed)
Please review previous message and advise Patient has had an [place] but no CT scans are resulted for this patient at this time;

## 2018-10-12 NOTE — Telephone Encounter (Signed)
Per OV note from 10/03/2018: Plan: - [place] complete abdomen as soon as available. - If still with problems, proceed with HIDA scan with ejection fraction to rule out biliary dyskinesia. - EGD if still with problems and the ultrasound is negative. Discussed risks and benefits. - Diet for sensitive stomachs.  Avoid raw vegetables especially broccoli.(Encouraged patient to continue following recommendations). - Continue Dexilant 30mg  po qd. #30, 11 refills. (Encouraged patient to continue following recommendations). - Follow-up in 12 weeks.  Earlier, if still with problems. - Encouraged her to continue exercising which would definitely help. (Encouraged patient to continue following recommendations). - Minimize all pain medications.  Increase water intake. (Encouraged patient to continue following recommendations). - If still with constipation, she would start taking stool softeners every day.  If still with problems, will give her a trial of Linzess. (Encouraged patient to continue following recommendations).   Called and spoke with patient-patient informed of result note and MD recommendations; patient is agreeable with plan of care; patient reports she is still having "gas a lot during the day after I eat-but I know I need to follow up with Dr. Lyndel Safe in 12 weeks and sooner if my pain gets worse";   Please advise on next step in plan of care;  Patient verbalized understanding of information/instructions;  Patient was advised to call the office at [phone] if questions/concerns arise;

## 2018-10-13 NOTE — Telephone Encounter (Signed)
Please stay with the plan

## 2018-10-16 ENCOUNTER — Other Ambulatory Visit: Payer: Self-pay

## 2018-10-16 DIAGNOSIS — R1013 Epigastric pain: Secondary | ICD-10-CM

## 2018-10-16 NOTE — Progress Notes (Unsigned)
Order placed in Epic for HIDA scan;

## 2018-10-16 NOTE — Telephone Encounter (Signed)
Called and spoke with patient-patient reports she is still with symptoms; patient informed of MD recommendations and patient is agreeable with plan of care, however, patient reports she does not drive and will have to call back to have her HIDA scan scheduled due to having to ask her niece for transportation; patient advised HIDA scan will be scheduled when she calls back to the office; Patient verbalized understanding of information/instructions; Patient was advised to call back if questions/concerns arise;

## 2018-10-24 ENCOUNTER — Telehealth: Payer: Self-pay | Admitting: Gastroenterology

## 2018-10-24 NOTE — Telephone Encounter (Signed)
Called and spoke with patient- patient given phone number for scheduling for her HIDA scan to be scheduled at [facility] as [phone]; Patient verbalized understanding of information/instructions; Patient was advised to call back if questions/concerns arise;

## 2018-11-03 ENCOUNTER — Encounter (HOSPITAL_COMMUNITY)
Admission: RE | Admit: 2018-11-03 | Discharge: 2018-11-03 | Disposition: A | Discharge status: Home / Self Care | Payer: Medicare Other | Source: Ambulatory Visit | Attending: Gastroenterology | Admitting: Gastroenterology

## 2018-11-03 DIAGNOSIS — R1013 Epigastric pain: Secondary | ICD-10-CM | POA: Insufficient documentation

## 2018-11-03 MED ORDER — TECHNETIUM TC 99M MEBROFENIN IV KIT: 5.5000 | PACK | Freq: Once | INTRAVENOUS | Status: DC | PRN

## 2018-11-06 ENCOUNTER — Telehealth: Payer: Self-pay | Admitting: Gastroenterology

## 2018-11-06 NOTE — Telephone Encounter (Signed)
Normal HIDA,  EF 91% Please inform patient about the results.

## 2018-11-06 NOTE — Telephone Encounter (Signed)
Pt called to enquire about lab results.

## 2018-11-06 NOTE — Telephone Encounter (Signed)
Please see previous message-patient is requesting the HIDA scan results; Please advise

## 2018-11-07 NOTE — Telephone Encounter (Signed)
Called and spoke with patient-patient informed of result note and MD recommendations; patient is agreeable with plan of care; Patient verbalized understanding of information/instructions;  Patient was advised to call the office at [phone] if questions/concerns arise; 

## 2018-12-01 ENCOUNTER — Other Ambulatory Visit: Payer: Self-pay

## 2018-12-01 ENCOUNTER — Encounter: Payer: Self-pay | Admitting: Sports Medicine

## 2018-12-01 ENCOUNTER — Ambulatory Visit (INDEPENDENT_AMBULATORY_CARE_PROVIDER_SITE_OTHER): Payer: Medicare Other | Admitting: Sports Medicine

## 2018-12-01 DIAGNOSIS — Q828 Other specified congenital malformations of skin: Secondary | ICD-10-CM | POA: Diagnosis not present

## 2018-12-01 DIAGNOSIS — L603 Nail dystrophy: Secondary | ICD-10-CM

## 2018-12-01 DIAGNOSIS — M79671 Pain in right foot: Secondary | ICD-10-CM

## 2018-12-01 DIAGNOSIS — M79672 Pain in left foot: Secondary | ICD-10-CM

## 2018-12-01 NOTE — Progress Notes (Signed)
Subjective: Isabella Rojas is a 68 y.o. female patient who presents to office for evaluation of Left foot pain secondary to callus skin. Patient complains of pain at the lesion present left foot at the ball that has been becoming painful 10 out of 10 sharp pain over the last month states that she has tried an over-the-counter topical pain cream does not recall stepping on anything. Patient also reports that her nails are thick at both big toenails and wants to talk about possible procedures.  Patient denies any other pedal complaints.   Patient Active Problem List   Diagnosis Date Noted  . Spondylolisthesis of lumbar region 05/27/2016    Current Outpatient Medications on File Prior to Visit  Medication Sig Dispense Refill  . Aromatic Inhalants (VAPOR INHALER IN) Inhale into the lungs as needed. Has an inhaler but doesn't know the name    . aspirin EC 81 MG tablet Take 81 mg by mouth daily.    . cyclobenzaprine (FLEXERIL) 10 MG tablet Take 1 tablet (10 mg total) by mouth 3 (three) times daily as needed for muscle spasms. 50 tablet 1  . Dexlansoprazole (DEXILANT) 30 MG capsule Take 1 capsule (30 mg total) by mouth daily. 30 capsule 11  . HYDROcodone-acetaminophen (NORCO) 10-325 MG tablet Take 1 tablet by mouth every 6 (six) hours as needed. 20 tablet 0  . lisinopril (PRINIVIL,ZESTRIL) 10 MG tablet     . PROAIR HFA 108 (90 Base) MCG/ACT inhaler INHALE 1 PUFF PO Q 6 H PRF WHEEZING OR SOB    . tiZANidine (ZANAFLEX) 4 MG tablet      No current facility-administered medications on file prior to visit.     Allergies  Allergen Reactions  . Oxycodone-Acetaminophen Palpitations  . Zoloft [Sertraline Hcl] Palpitations    "I started sweating and I felt like I was going to have a heart attack"    Objective:  General: Alert and oriented x3 in no acute distress  Dermatology: Keratotic lesion present sub-met 2 on left with a nucleated core with skin lines transversing the lesion, pain is present  with direct pressure to the lesion with a central nucleated core noted, no webspace macerations, no ecchymosis bilateral, old surgical scar on left well-healed.  All nails x 10 are well manicured but bilateral hallux is thick with mild trauma lines with no signs of infection.  Vascular: Dorsalis Pedis and Posterior Tibial pedal pulses 1/4, Capillary Fill Time 3 seconds, + pedal hair growth bilateral, no edema bilateral lower extremities, Temperature gradient within normal limits.  Neurology: Johney Maine sensation intact via light touch bilateral.  Musculoskeletal: Mild tenderness with palpation at the keratotic lesion site on left, no pain to palpation bilateral hallux nails, Muscular strength 5/5 in all groups without pain or limitation on range of motion.  Assessment and Plan: Problem List Items Addressed This Visit    None    Visit Diagnoses    Porokeratosis    -  Primary   Nail dystrophy       Foot pain, bilateral         -Complete examination performed -Discussed treatment options -Parred keratoic lesion using a chisel blade on left foot; treated the area withSalinocaine covered with moleskin -Encouraged daily skin emollients -Encouraged use of pumice stone -Discussed with patient the course of recovery for nail procedures and at this time we have decided that patient since she is not having pain and toenails should not undergo nail procedure so complementary using a rotary bur I did smooth  both big toenails and advised patient to use Vicks VapoRub to her nails and nail file to keep the thick toenails filed down -Advised good supportive shoes and inserts -Patient to return to office as needed or sooner if condition worsens.  Landis Martins, DPM

## 2019-01-03 ENCOUNTER — Ambulatory Visit (INDEPENDENT_AMBULATORY_CARE_PROVIDER_SITE_OTHER): Payer: Medicare Other | Admitting: Sports Medicine

## 2019-01-03 ENCOUNTER — Encounter: Payer: Self-pay | Admitting: Sports Medicine

## 2019-01-03 ENCOUNTER — Other Ambulatory Visit: Payer: Self-pay | Admitting: Sports Medicine

## 2019-01-03 ENCOUNTER — Other Ambulatory Visit: Payer: Self-pay

## 2019-01-03 ENCOUNTER — Telehealth: Payer: Self-pay | Admitting: *Deleted

## 2019-01-03 VITALS — Temp 98.1°F | Resp 16

## 2019-01-03 DIAGNOSIS — M779 Enthesopathy, unspecified: Secondary | ICD-10-CM

## 2019-01-03 DIAGNOSIS — M79672 Pain in left foot: Secondary | ICD-10-CM

## 2019-01-03 DIAGNOSIS — Z9889 Other specified postprocedural states: Secondary | ICD-10-CM

## 2019-01-03 DIAGNOSIS — Q828 Other specified congenital malformations of skin: Secondary | ICD-10-CM

## 2019-01-03 MED ORDER — MELOXICAM 15 MG PO TABS: 15.0000 mg | ORAL_TABLET | Freq: Every day | ORAL | 0 refills | Status: DC

## 2019-01-03 NOTE — Telephone Encounter (Signed)
Left message requesting pt call with the pharmacy she would like the medications sent to, that it was difficult to understand part of her message.

## 2019-01-03 NOTE — Telephone Encounter (Signed)
Pt stated she needed the pharmacy needed to be changed from [facility].

## 2019-01-03 NOTE — Progress Notes (Signed)
Subjective: Isabella Rojas is a 68 y.o. female patient who returns to office for evaluation of Left foot pain secondary to callus skin. Patient reports that her pain is not only at the callus but is ALL OVER her left foot and radiates all the way to her shin and has knee pain on left. Patient reports pain is worse the more that she is on her foot and after about 20 mins of walking for exercise the pain is sharp. States that she has been using good supportive shoes and pumice stone. Denies swelling, warmth, redness, nausea, vomiting, fever, or chills.  Patient denies any other pedal complaints.   Patient Active Problem List   Diagnosis Date Noted  . Spondylolisthesis of lumbar region 05/27/2016    Current Outpatient Medications on File Prior to Visit  Medication Sig Dispense Refill  . Aromatic Inhalants (VAPOR INHALER IN) Inhale into the lungs as needed. Has an inhaler but doesn't know the name    . aspirin EC 81 MG tablet Take 81 mg by mouth daily.    . cyclobenzaprine (FLEXERIL) 10 MG tablet Take 1 tablet (10 mg total) by mouth 3 (three) times daily as needed for muscle spasms. 50 tablet 1  . Dexlansoprazole (DEXILANT) 30 MG capsule Take 1 capsule (30 mg total) by mouth daily. 30 capsule 11  . HYDROcodone-acetaminophen (NORCO) 10-325 MG tablet Take 1 tablet by mouth every 6 (six) hours as needed. 20 tablet 0  . lisinopril (PRINIVIL,ZESTRIL) 10 MG tablet     . PROAIR HFA 108 (90 Base) MCG/ACT inhaler INHALE 1 PUFF PO Q 6 H PRF WHEEZING OR SOB    . SYMBICORT 160-4.5 MCG/ACT inhaler INHALE 2 PUFFS D PRN FOR WHEEZING    . tiZANidine (ZANAFLEX) 4 MG tablet      No current facility-administered medications on file prior to visit.     Allergies  Allergen Reactions  . Oxycodone-Acetaminophen Palpitations  . Zoloft [Sertraline Hcl] Palpitations    "I started sweating and I felt like I was going to have a heart attack"    Objective:  General: Alert and oriented x3 in no acute  distress  Dermatology: Keratotic lesion present sub-met 2 on left with a nucleated core with skin lines transversing the lesion, minimal pain is present with direct pressure to the lesion with a central nucleated core noted, no webspace macerations, no ecchymosis bilateral, old surgical scar on left well-healed.  All nails x 10 are well manicured but bilateral hallux is thick with mild trauma lines with no signs of infection.  Vascular: Dorsalis Pedis and Posterior Tibial pedal pulses 1/4, Capillary Fill Time 3 seconds, + pedal hair growth bilateral, no edema bilateral lower extremities, Temperature gradient within normal limits.  Neurology: Johney Maine sensation intact via light touch bilateral.  Musculoskeletal: Mild tenderness with palpation at the keratotic lesion site on left, no reproducible pain to left foot no pain to palpation however subjectively the patient reports that her entire left foot hurts and radiates to the shin when walking, Muscular strength 5/5 in all groups without pain or limitation on range of motion. No other deformities noted.   Assessment and Plan: Problem List Items Addressed This Visit    None    Visit Diagnoses    Tendonitis    -  Primary   Relevant Medications   meloxicam (MOBIC) 15 MG tablet   Left foot pain       Porokeratosis       Status post left foot surgery         -  Complete examination performed -Discussed treatment options -Parred keratoic lesion using a chisel blade on left foot; treated the area withSalinocaine covered with moleskin at no charge -Encouraged daily skin emollients -Encouraged use of pumice stone -Discussed with patient that likely her pain is coming from overuse tendonitis since the pain is all over the foot and leg when she is active or doing a lot of walking -Rx Mobic to take for pain and inflammation; may call if refill is needed -Recommend patient to get Airplus insoles OTC and to continue with good supportive shoes -Advised  patient to return to Ortho/Dr. Lorre Nick about left knee pain -Patient to return to office as needed or sooner if condition worsens.  Landis Martins, DPM

## 2019-01-03 NOTE — Progress Notes (Signed)
Changed Rx to [facility] -Dr. Cannon Kettle

## 2019-01-03 NOTE — Patient Instructions (Signed)
Airplus insoles from [employer]

## 2019-02-08 ENCOUNTER — Other Ambulatory Visit: Payer: Self-pay | Admitting: Sports Medicine

## 2019-02-08 DIAGNOSIS — M779 Enthesopathy, unspecified: Secondary | ICD-10-CM

## 2019-08-02 ENCOUNTER — Ambulatory Visit: Payer: Medicare Other | Admitting: Sports Medicine

## 2019-08-08 ENCOUNTER — Ambulatory Visit (INDEPENDENT_AMBULATORY_CARE_PROVIDER_SITE_OTHER): Payer: Medicare Other | Admitting: Sports Medicine

## 2019-08-08 ENCOUNTER — Encounter: Payer: Self-pay | Admitting: Sports Medicine

## 2019-08-08 ENCOUNTER — Other Ambulatory Visit: Payer: Self-pay | Admitting: Sports Medicine

## 2019-08-08 ENCOUNTER — Ambulatory Visit (INDEPENDENT_AMBULATORY_CARE_PROVIDER_SITE_OTHER): Payer: Medicare Other

## 2019-08-08 ENCOUNTER — Other Ambulatory Visit: Payer: Self-pay

## 2019-08-08 DIAGNOSIS — M722 Plantar fascial fibromatosis: Secondary | ICD-10-CM | POA: Diagnosis not present

## 2019-08-08 DIAGNOSIS — M79671 Pain in right foot: Secondary | ICD-10-CM

## 2019-08-08 DIAGNOSIS — M79672 Pain in left foot: Secondary | ICD-10-CM

## 2019-08-08 MED ORDER — DICLOFENAC SODIUM 75 MG PO TBEC: 75.0000 mg | DELAYED_RELEASE_TABLET | Freq: Two times a day (BID) | ORAL | 0 refills | Status: AC

## 2019-08-08 NOTE — Progress Notes (Signed)
Subjective: Isabella Rojas is a 68 y.o. female patient presents to office with complaint of moderate ball>arch> heel pain on the left for last 3 months.  Patient relates that pain is worse with weightbearing reports that pain is sharp worse at the ball of the foot pain also is present  even when not walking or standing.  Patient has treated this problem with icing and using heating pad with no relief. Denies any other pedal complaints.   Patient denies any trauma or injury but does report that she is status post left knee surgery and has issue with completely flexing her knee.  Patient Active Problem List   Diagnosis Date Noted  . Spondylolisthesis of lumbar region 05/27/2016    Current Outpatient Medications on File Prior to Visit  Medication Sig Dispense Refill  . alendronate (FOSAMAX) 70 MG tablet Take by mouth.    Marland Kitchen ibuprofen (ADVIL) 600 MG tablet     . metroNIDAZOLE (FLAGYL) 500 MG tablet Take by mouth.    . metroNIDAZOLE (METROGEL) 0.75 % vaginal gel Place vaginally.    Marland Kitchen oxybutynin (DITROPAN-XL) 10 MG 24 hr tablet Take by mouth.    . Aromatic Inhalants (VAPOR INHALER IN) Inhale into the lungs as needed. Has an inhaler but doesn't know the name    . aspirin EC 81 MG tablet Take 81 mg by mouth daily.    . cyclobenzaprine (FLEXERIL) 10 MG tablet Take 1 tablet (10 mg total) by mouth 3 (three) times daily as needed for muscle spasms. 50 tablet 1  . Dexlansoprazole (DEXILANT) 30 MG capsule Take 1 capsule (30 mg total) by mouth daily. 30 capsule 11  . HYDROcodone-acetaminophen (NORCO) 10-325 MG tablet Take 1 tablet by mouth every 6 (six) hours as needed. 20 tablet 0  . lisinopril (PRINIVIL,ZESTRIL) 10 MG tablet     . meloxicam (MOBIC) 15 MG tablet TAKE 1 TABLET BY MOUTH DAILY 30 tablet 0  . meloxicam (MOBIC) 15 MG tablet TAKE 1 TABLET BY MOUTH DAILY 30 tablet 0  . PROAIR HFA 108 (90 Base) MCG/ACT inhaler INHALE 1 PUFF PO Q 6 H PRF WHEEZING OR SOB    . SYMBICORT 160-4.5 MCG/ACT inhaler  INHALE 2 PUFFS D PRN FOR WHEEZING    . tiZANidine (ZANAFLEX) 4 MG tablet      No current facility-administered medications on file prior to visit.     Allergies  Allergen Reactions  . Oxycodone-Acetaminophen Palpitations  . Zoloft [Sertraline Hcl] Palpitations    "I started sweating and I felt like I was going to have a heart attack"    Objective: Physical Exam General: The patient is alert and oriented x3 in no acute distress.  Dermatology: Skin is warm, dry and supple bilateral lower extremities. Nails 1-10 are normal. There is no erythema, edema, no eccymosis, no open lesions present.  Old surgical scar well-healed.  Integument is otherwise unremarkable.  Vascular: Dorsalis Pedis pulse and Posterior Tibial pulse are 2/4 bilateral. Capillary fill time is immediate to all digits.  Neurological: Grossly intact to light touch with an achilles reflex of +2/5 and a  negative Tinel's sign bilateral.  Musculoskeletal: Tenderness to palpation at the arch>ball> medial calcaneal tubercale and through the insertion of the plantar fascia on the left foot no pain with compression of calcaneus bilateral. No pain with tuning fork to calcaneus bilateral. No pain with calf compression bilateral. There is decreased Ankle joint range of motion bilateral. All other joints range of motion within normal limits bilateral. Strength 5/5 in  all groups bilateral.   Gait: Unassisted, Antalgic avoid weight on left foot  Xray, left foot:  Normal osseous mineralization. Joint spaces preserved. No fracture/dislocation/boney destruction.  Minimal calcaneal spur present with mild thickening of plantar fascia.  Hardware intact at the fifth metatarsal.  No other soft tissue abnormalities or radiopaque foreign bodies.   Assessment and Plan: Problem List Items Addressed This Visit    None    Visit Diagnoses    Plantar fasciitis    -  Primary   Relevant Medications   diclofenac (VOLTAREN) 75 MG EC tablet   Left  foot pain         -Complete examination performed.  -Xrays reviewed -Discussed with patient in detail the condition of plantar fasciitis mostly at arch and ball distal, how this occurs and general treatment options. Explained both conservative and surgical treatments.  -Prescribed diclofenac to take as instructed -Recommended good supportive shoes and advised patient if plantar fascial brace works well as dispensed at today's visit may benefit long-term from custom orthotics -Explained and dispensed to patient daily stretching exercises. -Recommend patient to ice affected area 1-2x daily. -Patient to return to office if not better after 4 weeks or sooner if problems or questions arise.  Landis Martins, DPM

## 2019-08-15 ENCOUNTER — Other Ambulatory Visit: Payer: Self-pay | Admitting: Sports Medicine

## 2019-08-15 DIAGNOSIS — M722 Plantar fascial fibromatosis: Secondary | ICD-10-CM

## 2019-12-13 ENCOUNTER — Ambulatory Visit (INDEPENDENT_AMBULATORY_CARE_PROVIDER_SITE_OTHER): Payer: Medicare Other | Admitting: Sports Medicine

## 2019-12-13 ENCOUNTER — Other Ambulatory Visit: Payer: Self-pay

## 2019-12-13 ENCOUNTER — Encounter: Payer: Self-pay | Admitting: Sports Medicine

## 2019-12-13 DIAGNOSIS — B351 Tinea unguium: Secondary | ICD-10-CM | POA: Diagnosis not present

## 2019-12-13 DIAGNOSIS — M79674 Pain in right toe(s): Secondary | ICD-10-CM

## 2019-12-13 DIAGNOSIS — L602 Onychogryphosis: Secondary | ICD-10-CM

## 2019-12-13 DIAGNOSIS — M79675 Pain in left toe(s): Secondary | ICD-10-CM

## 2019-12-13 MED ORDER — NEOMYCIN-POLYMYXIN-HC 3.5-10000-1 OT SOLN: OTIC | 0 refills | Status: AC

## 2019-12-13 NOTE — Patient Instructions (Signed)

## 2019-12-13 NOTE — Progress Notes (Signed)
Subjective: Isabella Rojas is a 69 y.o. female patient presents to office today complaining of a moderately painful and thick toenails especially the left, grown back hurts in shoes, has been a problem for years. Patient has treated this by vicks and OTC antifungals. Patient denies fever/chills/nausea/vomitting/any other related constitutional symptoms at this time.  Patient Active Problem List   Diagnosis Date Noted  . Spondylolisthesis of lumbar region 05/27/2016    Current Outpatient Medications on File Prior to Visit  Medication Sig Dispense Refill  . alendronate (FOSAMAX) 70 MG tablet Take by mouth.    . Aromatic Inhalants (VAPOR INHALER IN) Inhale into the lungs as needed. Has an inhaler but doesn't know the name    . aspirin EC 81 MG tablet Take 81 mg by mouth daily.    . cyclobenzaprine (FLEXERIL) 10 MG tablet Take 1 tablet (10 mg total) by mouth 3 (three) times daily as needed for muscle spasms. 50 tablet 1  . Dexlansoprazole (DEXILANT) 30 MG capsule Take 1 capsule (30 mg total) by mouth daily. 30 capsule 11  . diclofenac (VOLTAREN) 75 MG EC tablet Take 1 tablet (75 mg total) by mouth 2 (two) times daily. 30 tablet 0  . HYDROcodone-acetaminophen (NORCO) 10-325 MG tablet Take 1 tablet by mouth every 6 (six) hours as needed. 20 tablet 0  . ibuprofen (ADVIL) 600 MG tablet     . lisinopril (PRINIVIL,ZESTRIL) 10 MG tablet     . meloxicam (MOBIC) 15 MG tablet TAKE 1 TABLET BY MOUTH DAILY 30 tablet 0  . meloxicam (MOBIC) 15 MG tablet TAKE 1 TABLET BY MOUTH DAILY 30 tablet 0  . metroNIDAZOLE (FLAGYL) 500 MG tablet Take by mouth.    . metroNIDAZOLE (METROGEL) 0.75 % vaginal gel Place vaginally.    Marland Kitchen oxybutynin (DITROPAN-XL) 10 MG 24 hr tablet Take by mouth.    Marland Kitchen PROAIR HFA 108 (90 Base) MCG/ACT inhaler INHALE 1 PUFF PO Q 6 H PRF WHEEZING OR SOB    . SYMBICORT 160-4.5 MCG/ACT inhaler INHALE 2 PUFFS D PRN FOR WHEEZING    . tiZANidine (ZANAFLEX) 4 MG tablet      No current  facility-administered medications on file prior to visit.    Allergies  Allergen Reactions  . Oxycodone-Acetaminophen Palpitations  . Zoloft [Sertraline Hcl] Palpitations    "I started sweating and I felt like I was going to have a heart attack"    Objective:  There were no vitals filed for this visit.  General: Well developed, nourished, in no acute distress, alert and oriented x3   Dermatology: Skin is warm, dry and supple bilateral. Right and left  hallux nail appears to be severely thickened and discolored. (-) Erythema. (+) Edema. (-) serosanguous drainage present. The remaining nails appear unremarkable at this time. There are no open sores, lesions or other signs of infection present. Old surgical scar on left well healed.   Vascular: Dorsalis Pedis artery and Posterior Tibial artery pedal pulses are 2/4 bilateral with immedate capillary fill time. Pedal hair growth present. No lower extremity edema.   Neruologic: Grossly intact via light touch bilateral.  Musculoskeletal: Tenderness to palpation of the right and left hallux nails. Muscular strength within normal limits in all groups bilateral.   Assesement and Plan: Problem List Items Addressed This Visit    None    Visit Diagnoses    Onychomycosis    -  Primary   Onychogryposis of toenail       Toe pain, bilateral          -  Discussed treatment alternatives and plan of care; Explained permanent/temporary nail avulsion and post procedure course to patient. Patient elects for removal of right and left hallux nails with application of phenol. - After a verbal and written consent, injected 3 ml of a 50:50 mixture of 2% plain  lidocaine and 0.5% plain marcaine in a normal hallux block fashion. Next, a  betadine prep was performed. Anesthesia was tested and found to be appropriate.  The offending right and left hallux nails completely was then incised from the hyponychium to the epinychium. The offending nail border was  removed and cleared from the field. The area was curretted for any remaining nail or spicules. Phenol application performed and the area was then flushed with alcohol and dressed with antibiotic cream and a dry sterile dressing. -Patient was instructed to leave the dressing intact for today and begin soaking  in a weak solution of betadine or Epsom salt and water tomorrow. Patient was instructed to  soak for 15-20 minutes each day and apply neosporin/corticosporin and a gauze or bandaid dressing each day. -Patient was instructed to monitor the toe for signs of infection and return to office if toe becomes red, hot or swollen. -Advised ice, elevation, and tylenol or motrin if needed for pain.  -Patient is to return in 2 weeks for follow up care/nail check or sooner if problems arise.  Landis Martins, DPM

## 2020-01-04 ENCOUNTER — Ambulatory Visit (INDEPENDENT_AMBULATORY_CARE_PROVIDER_SITE_OTHER): Payer: Medicaid Other | Admitting: Sports Medicine

## 2020-01-04 ENCOUNTER — Encounter: Payer: Self-pay | Admitting: Sports Medicine

## 2020-01-04 ENCOUNTER — Other Ambulatory Visit: Payer: Self-pay

## 2020-01-04 DIAGNOSIS — Z9889 Other specified postprocedural states: Secondary | ICD-10-CM

## 2020-01-04 DIAGNOSIS — M79674 Pain in right toe(s): Secondary | ICD-10-CM

## 2020-01-04 DIAGNOSIS — M79675 Pain in left toe(s): Secondary | ICD-10-CM

## 2020-01-04 NOTE — Progress Notes (Signed)
Subjective: Isabella Rojas is a 69 y.o. female patient returns to office today for follow up evaluation after having Right/Left Hallux total permanent nail avulsions performed on (12/13/2019). Patient has been soaking using epsom salt and applying topical antibiotic covered with bandaid daily. Patient admits pain to toes worse with direct touch 9/10 however relieved with Tylenol and with removing the Band-Aid. Deniesfever/chills/nausea/vomitting/any other related constitutional symptoms at this time.  Patient Active Problem List   Diagnosis Date Noted  . Spondylolisthesis of lumbar region 05/27/2016    Current Outpatient Medications on File Prior to Visit  Medication Sig Dispense Refill  . alendronate (FOSAMAX) 70 MG tablet Take by mouth.    . Aromatic Inhalants (VAPOR INHALER IN) Inhale into the lungs as needed. Has an inhaler but doesn't know the name    . aspirin EC 81 MG tablet Take 81 mg by mouth daily.    . cyclobenzaprine (FLEXERIL) 10 MG tablet Take 1 tablet (10 mg total) by mouth 3 (three) times daily as needed for muscle spasms. 50 tablet 1  . Dexlansoprazole (DEXILANT) 30 MG capsule Take 1 capsule (30 mg total) by mouth daily. 30 capsule 11  . diclofenac (VOLTAREN) 75 MG EC tablet Take 1 tablet (75 mg total) by mouth 2 (two) times daily. 30 tablet 0  . HYDROcodone-acetaminophen (NORCO) 10-325 MG tablet Take 1 tablet by mouth every 6 (six) hours as needed. 20 tablet 0  . ibuprofen (ADVIL) 600 MG tablet     . lisinopril (PRINIVIL,ZESTRIL) 10 MG tablet     . meloxicam (MOBIC) 15 MG tablet TAKE 1 TABLET BY MOUTH DAILY 30 tablet 0  . meloxicam (MOBIC) 15 MG tablet TAKE 1 TABLET BY MOUTH DAILY 30 tablet 0  . metroNIDAZOLE (FLAGYL) 500 MG tablet Take by mouth.    . metroNIDAZOLE (METROGEL) 0.75 % vaginal gel Place vaginally.    Marland Kitchen neomycin-polymyxin-hydrocortisone (CORTISPORIN) OTIC solution Apply 2-3 drops to the ingrown toenail site twice daily. Cover with band-aid. 10 mL 0  .  oxybutynin (DITROPAN-XL) 10 MG 24 hr tablet Take by mouth.    Marland Kitchen PROAIR HFA 108 (90 Base) MCG/ACT inhaler INHALE 1 PUFF PO Q 6 H PRF WHEEZING OR SOB    . SYMBICORT 160-4.5 MCG/ACT inhaler INHALE 2 PUFFS D PRN FOR WHEEZING    . tiZANidine (ZANAFLEX) 4 MG tablet      No current facility-administered medications on file prior to visit.    Allergies  Allergen Reactions  . Oxycodone-Acetaminophen Palpitations  . Zoloft [Sertraline Hcl] Palpitations    "I started sweating and I felt like I was going to have a heart attack"    Objective:  General: Well developed, nourished, in no acute distress, alert and oriented x3   Dermatology: Skin is warm, dry and supple bilateral.  Right and left hallux nail bed appears to be clean, dry, with mild granular tissue and surrounding eschar/scab. (-) Erythema. (-) Edema. (-) serosanguous drainage present. The remaining nails appear unremarkable at this time. There are no other lesions or other signs of infection  present.  Neurovascular status: Intact. No lower extremity swelling; No pain with calf compression bilateral.  Musculoskeletal: Tenderness palpation bilateral hallux nail bed. Muscular strength within normal limits bilateral.   Assesement and Plan: Problem List Items Addressed This Visit    None    Visit Diagnoses    Status post nail surgery    -  Primary   Toe pain, bilateral          -Examined patient  -  Cleansed right/left hallux medial/lateral nail fold and gently scrubbed with peroxide and q-tip/curetted away eschar at site and applied antibiotic cream covered with bandaid.  -Discussed plan of care with patient. -Patient to now begin soaking in a weak solution of Epsom salt and warm water. Patient was instructed to soak for 15-20 minutes each day until the toe appears normal and there is no drainage, redness, tenderness, or swelling at the procedure site, and apply neosporin and a gauze or bandaid dressing each day as needed. May leave  open to air at night. -Continue with over-the-counter medication for pain control and avoid putting Band-Aid on too tight -Educated patient on long term care after nail surgery. -Patient was instructed to monitor the toe for reoccurrence and signs of infection; Patient advised to return to office or go to ER if toe becomes red, hot or swollen. -Patient is to return in 3 to 4 weeks for final nail check or sooner if problems arise.  Landis Martins, DPM

## 2020-02-01 ENCOUNTER — Ambulatory Visit (INDEPENDENT_AMBULATORY_CARE_PROVIDER_SITE_OTHER): Payer: Medicare Other | Admitting: Sports Medicine

## 2020-02-01 ENCOUNTER — Encounter: Payer: Self-pay | Admitting: Sports Medicine

## 2020-02-01 ENCOUNTER — Other Ambulatory Visit: Payer: Self-pay

## 2020-02-01 DIAGNOSIS — M722 Plantar fascial fibromatosis: Secondary | ICD-10-CM | POA: Diagnosis not present

## 2020-02-01 DIAGNOSIS — Z9889 Other specified postprocedural states: Secondary | ICD-10-CM

## 2020-02-01 DIAGNOSIS — M79675 Pain in left toe(s): Secondary | ICD-10-CM

## 2020-02-01 DIAGNOSIS — M79672 Pain in left foot: Secondary | ICD-10-CM

## 2020-02-01 NOTE — Progress Notes (Signed)
Subjective: Isabella Rojas is a 69 y.o. female patient returns to office today for follow up evaluation after having Right/Left Hallux total permanent nail avulsions performed on (12/13/2019). Patient has been soaking using epsom salt and applying topical antibiotic and cortisone covered with bandaid daily. Patient admits that these areas are scabbing and peeling but they do not look too good.  Patient reports that she is having some pain on the bottom of her left foot along the arch area.  Deniesfever/chills/nausea/vomitting/any other related constitutional symptoms at this time.  Patient Active Problem List   Diagnosis Date Noted  . Spondylolisthesis of lumbar region 05/27/2016    Current Outpatient Medications on File Prior to Visit  Medication Sig Dispense Refill  . alendronate (FOSAMAX) 70 MG tablet Take by mouth.    . Aromatic Inhalants (VAPOR INHALER IN) Inhale into the lungs as needed. Has an inhaler but doesn't know the name    . aspirin EC 81 MG tablet Take 81 mg by mouth daily.    . cyclobenzaprine (FLEXERIL) 10 MG tablet Take 1 tablet (10 mg total) by mouth 3 (three) times daily as needed for muscle spasms. 50 tablet 1  . Dexlansoprazole (DEXILANT) 30 MG capsule Take 1 capsule (30 mg total) by mouth daily. 30 capsule 11  . diclofenac (VOLTAREN) 75 MG EC tablet Take 1 tablet (75 mg total) by mouth 2 (two) times daily. 30 tablet 0  . HYDROcodone-acetaminophen (NORCO) 10-325 MG tablet Take 1 tablet by mouth every 6 (six) hours as needed. 20 tablet 0  . ibuprofen (ADVIL) 600 MG tablet     . lisinopril (PRINIVIL,ZESTRIL) 10 MG tablet     . meloxicam (MOBIC) 15 MG tablet TAKE 1 TABLET BY MOUTH DAILY 30 tablet 0  . meloxicam (MOBIC) 15 MG tablet TAKE 1 TABLET BY MOUTH DAILY 30 tablet 0  . metroNIDAZOLE (FLAGYL) 500 MG tablet Take by mouth.    . metroNIDAZOLE (METROGEL) 0.75 % vaginal gel Place vaginally.    Marland Kitchen neomycin-polymyxin-hydrocortisone (CORTISPORIN) OTIC solution Apply 2-3 drops  to the ingrown toenail site twice daily. Cover with band-aid. 10 mL 0  . oxybutynin (DITROPAN-XL) 10 MG 24 hr tablet Take by mouth.    Marland Kitchen PROAIR HFA 108 (90 Base) MCG/ACT inhaler INHALE 1 PUFF PO Q 6 H PRF WHEEZING OR SOB    . SYMBICORT 160-4.5 MCG/ACT inhaler INHALE 2 PUFFS D PRN FOR WHEEZING    . tiZANidine (ZANAFLEX) 4 MG tablet      No current facility-administered medications on file prior to visit.    Allergies  Allergen Reactions  . Oxycodone-Acetaminophen Palpitations  . Zoloft [Sertraline Hcl] Palpitations    "I started sweating and I felt like I was going to have a heart attack"    Objective:  General: Well developed, nourished, in no acute distress, alert and oriented x3   Dermatology: Skin is warm, dry and supple bilateral.  Right and left hallux nail bed appears to be clean, dry scabbing.  Minimal focal blanchable erythema at proximal nail fold consistent with procedure site healing, (-) Edema, (-) serosanguous drainage present. The remaining nails appear unremarkable at this time. There are no other lesions or other signs of infection present.  Neurovascular status: Intact. No lower extremity swelling; No pain with calf compression bilateral.  Musculoskeletal: Mild tenderness palpation bilateral hallux nail bed.  Mild tenderness to palpation to arch on the left likely secondary to compensation after nail procedure.  Muscular strength within normal limits bilateral.   Assesement and Plan:  Problem List Items Addressed This Visit    None    Visit Diagnoses    Status post nail surgery    -  Primary   Toe pain, bilateral       Arch pain, left           -Examined patient  -Advised patient to discontinue soaks and antibiotic cream may open to air at night and remove Band-Aid -Advised patient that the appearance of her toes are consistent with post nail procedure healing -Advised patient for her arch pain on the left to do daily stretching and icing with a frozen water  bottle -Continue with good supportive shoes daily for foot type -Patient is to return  as needed or sooner if problems or issues arise or if her left arch fails to improve Landis Martins, DPM

## 2021-03-26 ENCOUNTER — Other Ambulatory Visit: Payer: Self-pay | Admitting: Orthopedic Surgery

## 2021-03-26 DIAGNOSIS — M25562 Pain in left knee: Secondary | ICD-10-CM

## 2021-04-01 ENCOUNTER — Ambulatory Visit
Admission: RE | Admit: 2021-04-01 | Discharge: 2021-04-01 | Disposition: A | Discharge status: Home / Self Care | Payer: Medicare Other | Source: Ambulatory Visit | Attending: Orthopedic Surgery | Admitting: Orthopedic Surgery

## 2021-04-01 ENCOUNTER — Other Ambulatory Visit: Payer: Self-pay

## 2021-04-01 DIAGNOSIS — M25562 Pain in left knee: Secondary | ICD-10-CM

## 2021-11-09 ENCOUNTER — Other Ambulatory Visit: Payer: Self-pay | Admitting: Nurse Practitioner

## 2021-11-09 DIAGNOSIS — I714 Abdominal aortic aneurysm, without rupture, unspecified: Secondary | ICD-10-CM

## 2021-11-23 ENCOUNTER — Ambulatory Visit: Payer: Medicare Other

## 2021-11-30 ENCOUNTER — Ambulatory Visit: Payer: Medicare Other

## 2023-04-09 IMAGING — MR MR KNEE*L* W/O CM
4 of 7 series · 21 of 40 positions shown · non-contrast
Comparison: X-ray 03/24/2021, MRI 03/20/2019

CLINICAL DATA: Left knee pain and swelling. History of arthroscopic
knee surgery

EXAM:
MRI OF THE LEFT KNEE WITHOUT CONTRAST
TECHNIQUE: Multiplanar, multisequence MR imaging of the knee was performed. No
intravenous contrast was administered.

[Series 3: T2 fat-sat · axial · 4.0mm · 0.50mm/px · z∈[-49,+66]mm · 3 of 24 slices shown]
[im 1/24]
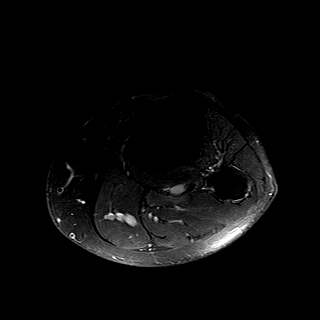
[im 12/24]
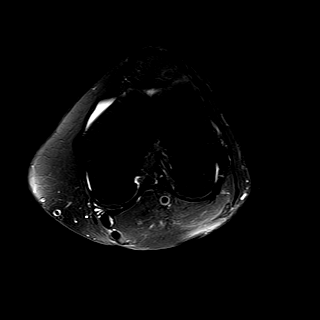
[im 24/24]
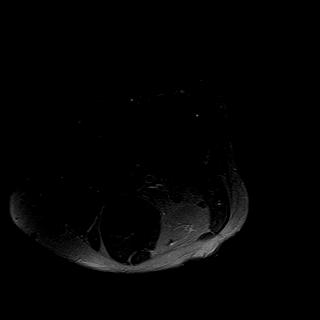

[Series 7: PD fat-sat · sagittal · 3.0mm · 0.29mm/px · 7 of 28 slices shown (1 of 3)]
[im 1/28]
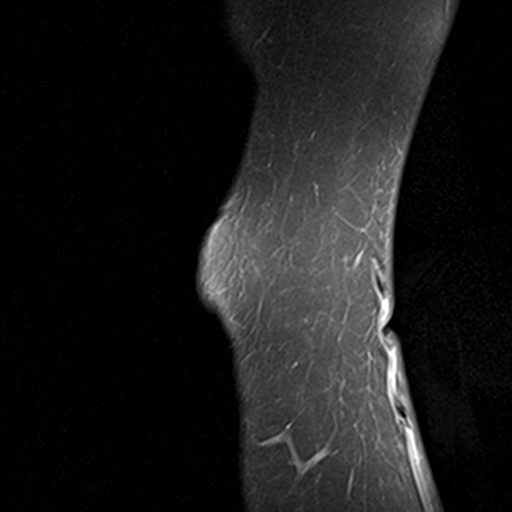
[im 5/28]
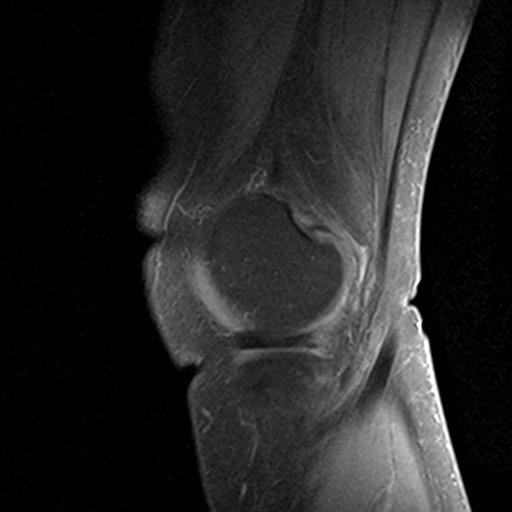
[im 10/28]
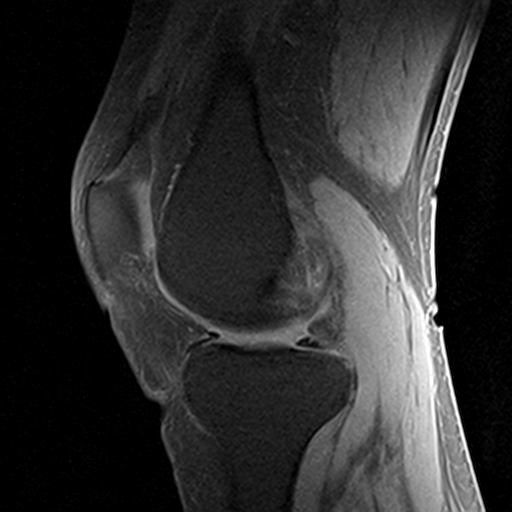
[im 14/28]
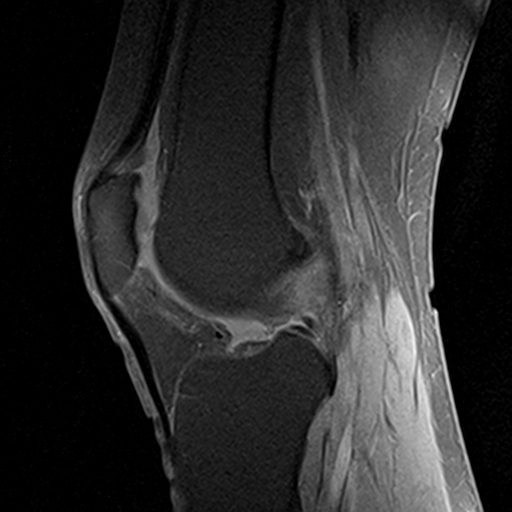
[im 19/28]
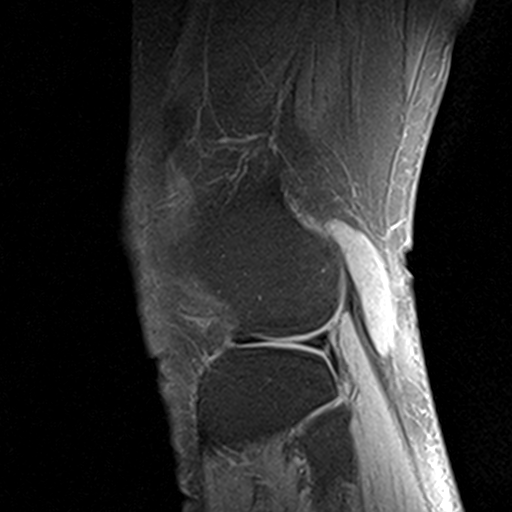
[im 23/28]
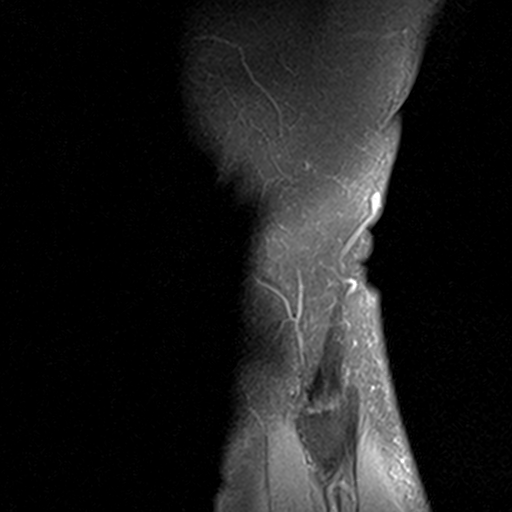
[im 28/28]
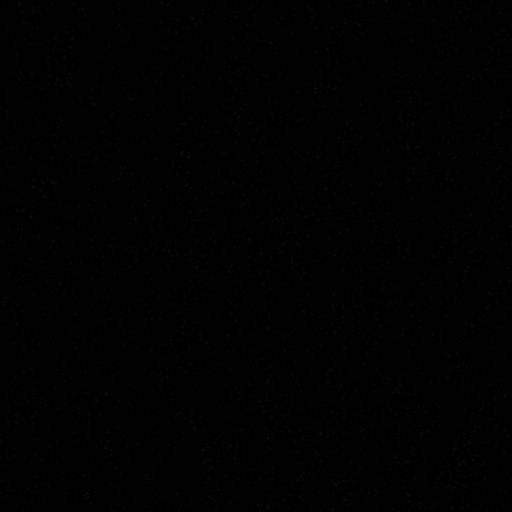

[Series 8: PD fat-sat · coronal · 3.0mm · 0.29mm/px · 8 of 32 slices shown (2 of 3)]
[im 1/32]
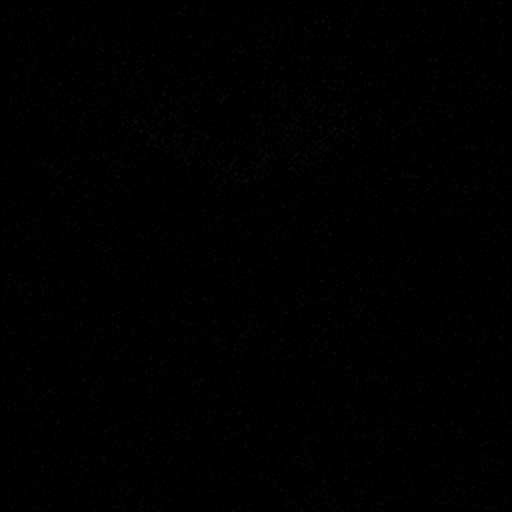
[im 5/32]
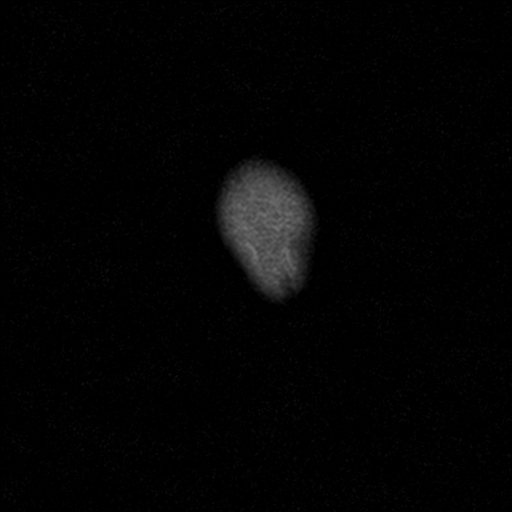
[im 9/32]
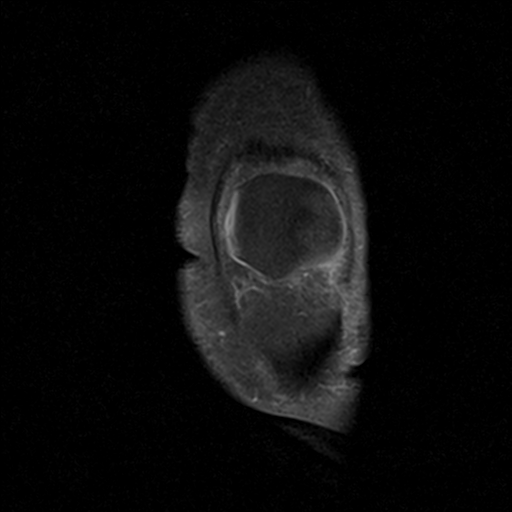
[im 14/32]
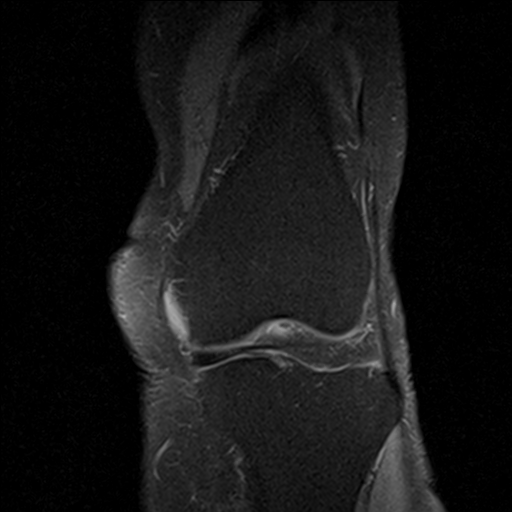
[im 18/32]
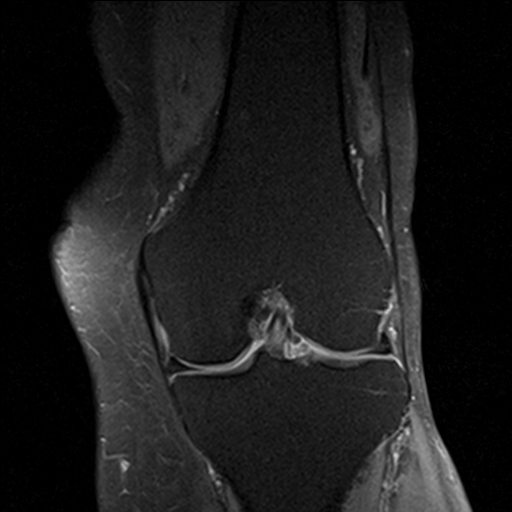
[im 23/32]
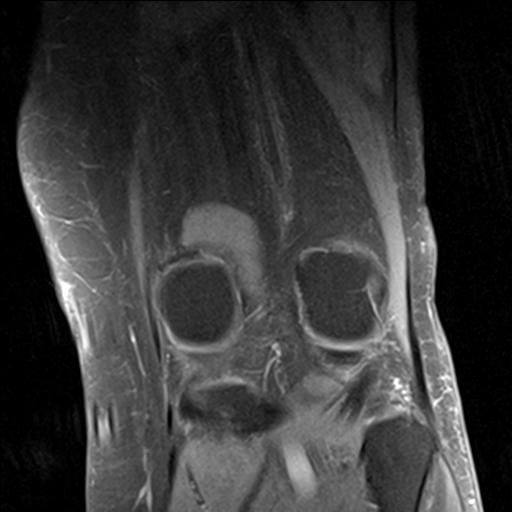
[im 27/32]
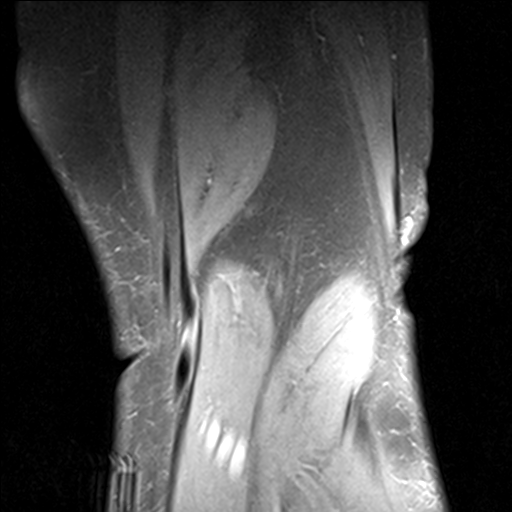
[im 32/32]
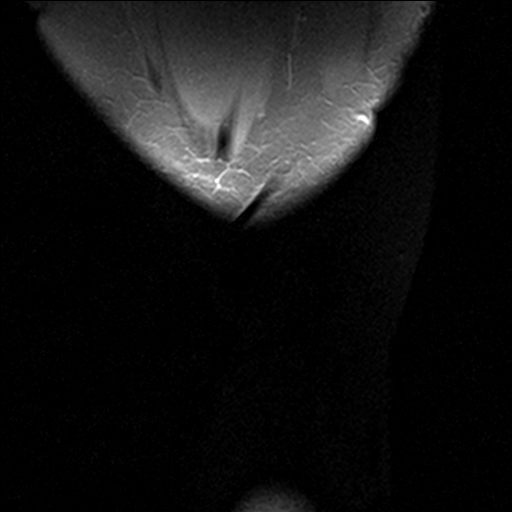

[Series 9: PD fat-sat · oblique · 2.3mm · 0.29mm/px · 3 of 11 slices shown (3 of 3)]
[im 1/11]
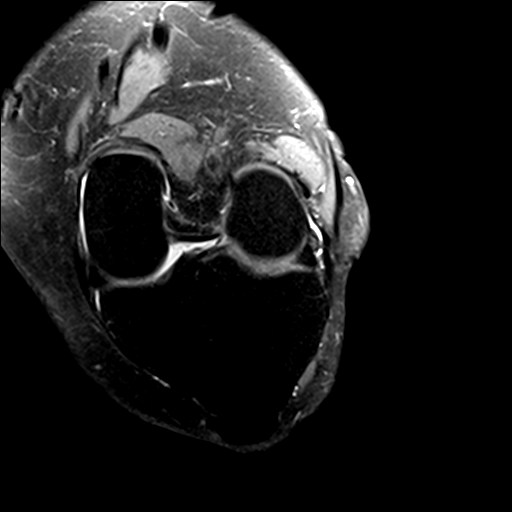
[im 6/11]
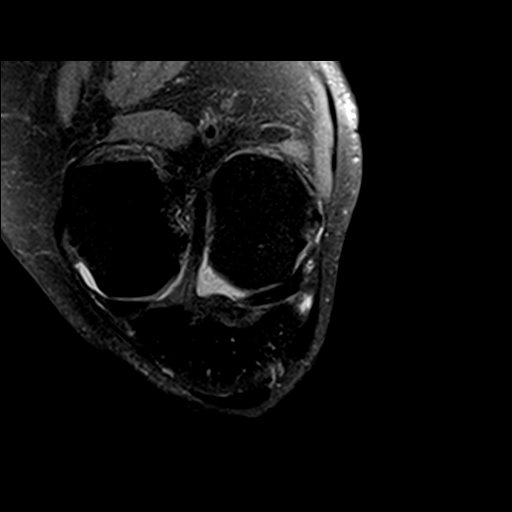
[im 11/11]
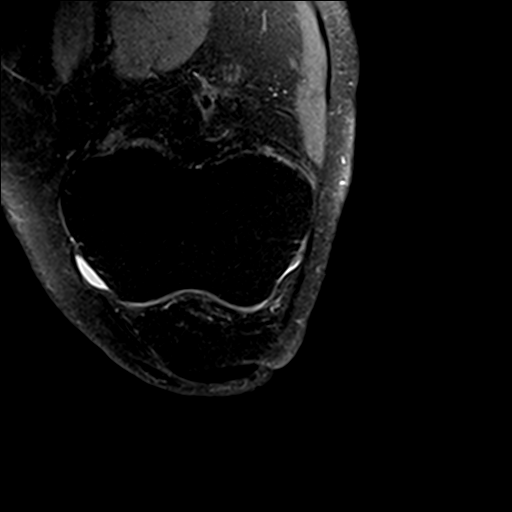

[21 of 40 positions shown; findings below may reference images not displayed]

FINDINGS: MENISCI

Medial meniscus: Residual intermediate signal at the posterior
horn-body junction of the medial meniscus extending to the inferior
articular surface (series 7, image 23) without progression compared
to the previous MRI. No new medial meniscal tear.

Lateral meniscus:  Intact.

LIGAMENTS

Cruciates:  Intact ACL and PCL.

Collaterals: Medial collateral ligament is intact. Lateral
collateral ligament complex is intact.

CARTILAGE

Patellofemoral:  No chondral defect.

Medial: Minimal chondral thinning of the weight-bearing surfaces,
unchanged. No chondral defect.

Lateral:  No chondral defect.

Joint:  Trace joint effusion.  Fat pads within normal limits.

Popliteal Fossa: Trace amount of fluid within a Baker's cyst. Intact
popliteus tendon.

Extensor Mechanism:  Intact quadriceps tendon and patellar tendon.

Bones: No focal marrow signal abnormality. No fracture or
dislocation.

Other: None.
IMPRESSION: Residual intermediate signal at the posterior horn-body junction of
the medial meniscus at site of previously seen tear. Findings may
reflect postsurgical changes with possible residual tear component.
No interval progression from prior MRI. No new medial meniscal tear.
